# Patient Record
Sex: Female | Born: 1980 | Race: White | Hispanic: No | Marital: Married | Smoking: Never smoker
Health system: Southern US, Community
[De-identification: ages and names within clinical notes are randomized; demographics above are authoritative.]

## PROBLEM LIST (undated history)

## (undated) DIAGNOSIS — Z808 Family history of malignant neoplasm of other organs or systems: Secondary | ICD-10-CM

## (undated) DIAGNOSIS — Z803 Family history of malignant neoplasm of breast: Secondary | ICD-10-CM

## (undated) DIAGNOSIS — R011 Cardiac murmur, unspecified: Secondary | ICD-10-CM

## (undated) DIAGNOSIS — Z8051 Family history of malignant neoplasm of kidney: Secondary | ICD-10-CM

## (undated) DIAGNOSIS — Z8049 Family history of malignant neoplasm of other genital organs: Secondary | ICD-10-CM

## (undated) DIAGNOSIS — Z8041 Family history of malignant neoplasm of ovary: Secondary | ICD-10-CM

## (undated) DIAGNOSIS — R61 Generalized hyperhidrosis: Secondary | ICD-10-CM

## (undated) DIAGNOSIS — F419 Anxiety disorder, unspecified: Secondary | ICD-10-CM

## (undated) DIAGNOSIS — E349 Endocrine disorder, unspecified: Secondary | ICD-10-CM

## (undated) DIAGNOSIS — I10 Essential (primary) hypertension: Secondary | ICD-10-CM

## (undated) HISTORY — DX: Anxiety disorder, unspecified: F41.9

## (undated) HISTORY — DX: Family history of malignant neoplasm of breast: Z80.3

## (undated) HISTORY — DX: Family history of malignant neoplasm of ovary: Z80.41

## (undated) HISTORY — DX: Family history of malignant neoplasm of other genital organs: Z80.49

## (undated) HISTORY — DX: Essential (primary) hypertension: I10

## (undated) HISTORY — DX: Endocrine disorder, unspecified: E34.9

## (undated) HISTORY — DX: Family history of malignant neoplasm of kidney: Z80.51

## (undated) HISTORY — DX: Family history of malignant neoplasm of other organs or systems: Z80.8

---
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
## 1998-06-26 ENCOUNTER — Ambulatory Visit (HOSPITAL_COMMUNITY): Admission: RE | Admit: 1998-06-26 | Discharge: 1998-06-26 | Payer: Self-pay | Admitting: Family Medicine

## 1999-07-15 ENCOUNTER — Other Ambulatory Visit: Admission: RE | Admit: 1999-07-15 | Discharge: 1999-07-15 | Payer: Self-pay | Admitting: *Deleted

## 2002-02-24 ENCOUNTER — Other Ambulatory Visit: Admission: RE | Admit: 2002-02-24 | Discharge: 2002-02-24 | Payer: Self-pay | Admitting: *Deleted

## 2003-02-27 ENCOUNTER — Other Ambulatory Visit: Admission: RE | Admit: 2003-02-27 | Discharge: 2003-02-27 | Payer: Self-pay | Admitting: Gynecology

## 2004-01-14 HISTORY — PX: TONSILLECTOMY: SHX5217

## 2004-03-06 ENCOUNTER — Other Ambulatory Visit: Admission: RE | Admit: 2004-03-06 | Discharge: 2004-03-06 | Payer: Self-pay | Admitting: Gynecology

## 2004-11-01 ENCOUNTER — Emergency Department (HOSPITAL_COMMUNITY): Admission: EM | Admit: 2004-11-01 | Discharge: 2004-11-01 | Payer: Self-pay | Admitting: Emergency Medicine

## 2004-11-01 ENCOUNTER — Ambulatory Visit (HOSPITAL_BASED_OUTPATIENT_CLINIC_OR_DEPARTMENT_OTHER): Admission: RE | Admit: 2004-11-01 | Discharge: 2004-11-02 | Payer: Self-pay | Admitting: Otolaryngology

## 2004-11-01 ENCOUNTER — Encounter (INDEPENDENT_AMBULATORY_CARE_PROVIDER_SITE_OTHER): Payer: Self-pay | Admitting: Specialist

## 2004-11-01 ENCOUNTER — Ambulatory Visit (HOSPITAL_COMMUNITY): Admission: RE | Admit: 2004-11-01 | Discharge: 2004-11-01 | Payer: Self-pay | Admitting: Otolaryngology

## 2005-04-15 ENCOUNTER — Other Ambulatory Visit: Admission: RE | Admit: 2005-04-15 | Discharge: 2005-04-15 | Payer: Self-pay | Admitting: Gynecology

## 2006-04-20 ENCOUNTER — Other Ambulatory Visit: Admission: RE | Admit: 2006-04-20 | Discharge: 2006-04-20 | Payer: Self-pay | Admitting: Gynecology

## 2007-04-21 ENCOUNTER — Other Ambulatory Visit: Admission: RE | Admit: 2007-04-21 | Discharge: 2007-04-21 | Payer: Self-pay | Admitting: Gynecology

## 2008-04-24 ENCOUNTER — Encounter: Payer: Self-pay | Admitting: Women's Health

## 2008-04-24 ENCOUNTER — Other Ambulatory Visit: Admission: RE | Admit: 2008-04-24 | Discharge: 2008-04-24 | Payer: Self-pay | Admitting: Obstetrics and Gynecology

## 2008-04-24 ENCOUNTER — Ambulatory Visit: Payer: Self-pay | Admitting: Women's Health

## 2009-01-13 HISTORY — PX: WISDOM TOOTH EXTRACTION: SHX21

## 2009-04-25 ENCOUNTER — Ambulatory Visit: Payer: Self-pay | Admitting: Women's Health

## 2009-04-25 ENCOUNTER — Other Ambulatory Visit: Admission: RE | Admit: 2009-04-25 | Discharge: 2009-04-25 | Payer: Self-pay | Admitting: Gynecology

## 2010-05-01 ENCOUNTER — Encounter: Payer: Self-pay | Admitting: Women's Health

## 2010-05-08 ENCOUNTER — Encounter: Payer: Self-pay | Admitting: Women's Health

## 2010-05-17 ENCOUNTER — Other Ambulatory Visit: Payer: Self-pay | Admitting: Women's Health

## 2010-05-17 ENCOUNTER — Other Ambulatory Visit (HOSPITAL_COMMUNITY)
Admission: RE | Admit: 2010-05-17 | Discharge: 2010-05-17 | Disposition: A | Discharge status: Home / Self Care | Payer: BC Managed Care – PPO | Source: Ambulatory Visit | Attending: Gynecology | Admitting: Gynecology

## 2010-05-17 ENCOUNTER — Encounter (INDEPENDENT_AMBULATORY_CARE_PROVIDER_SITE_OTHER): Payer: BC Managed Care – PPO | Admitting: Women's Health

## 2010-05-17 DIAGNOSIS — E039 Hypothyroidism, unspecified: Secondary | ICD-10-CM

## 2010-05-17 DIAGNOSIS — Z01419 Encounter for gynecological examination (general) (routine) without abnormal findings: Secondary | ICD-10-CM

## 2010-05-17 DIAGNOSIS — Z124 Encounter for screening for malignant neoplasm of cervix: Secondary | ICD-10-CM | POA: Insufficient documentation

## 2010-05-31 NOTE — Op Note (Signed)
NAMEMORAYMA, GODOWN NO.:  [id number]   MEDICAL RECORD NO.:  [id number]          PATIENT TYPE:  OUT   LOCATION:  DFTL                         FACILITY:  [facility]   PHYSICIAN:  Lucky Cowboy, MD         DATE OF BIRTH:  Oct 09, 1980   DATE OF PROCEDURE:  11/01/2004  DATE OF DISCHARGE:  11/01/2004                                 OPERATIVE REPORT   PREOPERATIVE DIAGNOSES:  1.  Chronic tonsillitis.  2.  Bilateral inferior turbinate hypertrophy.   POSTOPERATIVE DIAGNOSES:  1.  Chronic tonsillitis  2.  Bilateral inferior turbinate hypertrophy.  3.  Adenoid hypertrophy.   PROCEDURE:  1.  Adenotonsillectomy.  2.  Bilateral inferior turbinate reductions.   SURGEON:  Lucky Cowboy, MD.   ANESTHESIA:  General endotracheal anesthesia.   ESTIMATED BLOOD LOSS:  30 cc.   SPECIMENS:  Tonsils and adenoids.   COMPLICATIONS:  None.   INDICATIONS:  Patient is a 30 year old female with frequent sore throats for  as long as she can remember. She experiences sore throats every 2 months.  She does have a history of strep throat. There is also nasal congestion.  This has not responded well to steroid nasal spray in the past. She was  noted to have 3+ bilateral cryptic tonsils. There is a 1.5 cm left zone II  lymph node. Further, there is moderate inferior turbinate hypertrophy. For  these reasons, the above procedures are performed.   FINDINGS:  The patient was noted to have 3+ bilateral cryptic palatine  tonsils. Further, on inspection of the nasopharynx, there is an obstructing  amount of adenoid hypertrophy. This was noted at the time of doing the  inferior turbinate reductions. There was both bony and mucosal inferior  turbinate enlargement.   PROCEDURE:  The patient was taken to the operating room and placed on the  table in the supine position. She was then placed under general endotracheal  anesthesia and the table rotated counterclockwise 90 degrees. Initially, the  inferior turbinate reduction was performed. Both of the inferior turbinates  were decongested with Afrin on cottonoid pledgets.  Lidocaine 1% with  1:100,000 epinephrine was then used to inject both of the inferior  turbinates. The microdebrider was used to debride the inferior one-half of  both the inferior turbinates. Through-cut forceps were then used to remove  the inferior  one-half of both the inferior turbinates. Suction cautery was  used for hemostasis.   At this point, attention was turned to the tonsillectomy portion of the  procedure. The head and body were draped in the usual fashion. This also had  been performed for the inferior turbinate reduction. A Crowe-Davis mouth gag  with a #3 tongue blade was then placed intraorally, opened and suspended on  the [facility] stand. The right palatine tonsil was grasped with Allis clamps and  directed inferomedially. The Harmonic scalpel was then used to excise the  tonsil, staying within the peritonsillar space adjacent to the tonsillar  capsule. The left palatine tonsil was removed in an identical fashion. After  seeing that there was a profuse amount of adenoid  hypertrophy at the  turbinate portion of the procedure, the palate was then elevated after  palpating the palate to ensure no evidence of a submucosal cleft. A red  rubber catheter was placed on the left nostril, brought out through the oral  cavity and sutured in place with a hemostat. A large adenoid curet was  placed against the bone and directed inferiorly, severing the majority the  adenoid pad. The remainder was removed using subsequent passes. Two sterile  gauze packs were placed in the nasopharynx and time allowed for hemostasis.  Packs were removed and suction cautery performed. The nasopharynx was  copiously irrigated with normal saline, which was suctioned out through the  oral cavity. An NG tube was placed on the esophagus for suctioning of the  gastric contents. The  mouth gag was removed, noting no damage to the teeth  or soft tissues. The mouth gag was removed and that the table was rotated  clockwise 90 degrees to its original position. The patient was awakened from  anesthesia and taken to the Post Anesthesia Care Unit in stable condition.  There were no complications.      Lucky Cowboy, MD  Electronically Signed     SJ/MEDQ  D:  12/22/2004  T:  12/23/2004  Job:  [id number]

## 2010-05-31 NOTE — Op Note (Signed)
NAMEMarland Kitchen  WONDER, DONAWAY NO.:  [id number]   MEDICAL RECORD NO.:  [id number]          PATIENT TYPE:  EMS   LOCATION:  MAJO                         FACILITY:  [facility]   PHYSICIAN:  Suzanna Obey, M.D.       DATE OF BIRTH:  October 08, 1980   DATE OF PROCEDURE:  11/01/2004  DATE OF DISCHARGE:  11/01/2004                                 OPERATIVE REPORT   PREOPERATIVE DIAGNOSIS:  Tonsillar hemorrhage.   POSTOPERATIVE DIAGNOSIS:  Tonsillar hemorrhage.   PROCEDURE:  Cauterization of left tonsil hemorrhage.   ANESTHESIA:  General endotracheal tube anesthesia.   ESTIMATED BLOOD LOSS:  Approximately 300 mL from previous bleeding.   INDICATIONS FOR PROCEDURE:  This is a 30 year old who has had a  tonsillectomy today and a turbinate reduction.  She had some bleeding  earlier in the day and it was stopped with ice water gargling.  She now has  had bleeding that has persisted for about an hour and it is causing clots in  her throat and mouth.  She was informed of the risks and benefits including  bleeding, infection, scarring, persistent bleeding, and risk of the  anesthetic.  All questions were answered and consent was obtained.   DESCRIPTION OF PROCEDURE:  Patient taken to the operating room and placed in  the supine position.  After adequate general endotracheal tube anesthesia,  was placed in the rose position, draped in the usual sterile manner.  CroweEarlene Plater mouth gag was inserted, retracted and suspected from the [facility] stand.  There was a large amount of clot and blood in the oral cavity, oropharynx.  It was suctioned out.  There was a clot in the left tonsil that was removed  and once removed, there was an obvious pumping artery that was cauterized  with the suction cautery.  There was several other oozing spots in the  inferior and lateral aspects of the tonsillar fossa.  There was one spot in  the right tonsillar fossa that was just oozing.  This seemed to correct all  the  bleeding.  It was irrigated with saline.  There was no further sights  that could be identified.  The Crowe-Davis was released and there was still  good hemostasis.  The hypopharynx, esophagus and stomach were suctioned with  the NG tube where about 250 to 300 mL of bleed was suctioned from the  stomach.  The Crowe-Davis was removed.  The patient was awakened and brought  to the recovery room in stable condition.  Counts correct.           ______________________________  Suzanna Obey, M.D.     JB/MEDQ  D:  11/01/2004  T:  11/03/2004  Job:  [id number]

## 2011-01-14 NOTE — L&D Delivery Note (Signed)
Operative Delivery Note At 8:23 AM a viable female was delivered via Vaginal, Vacuum Investment banker, operational).  Presentation: vertex; Position: Right,, Occiput,, Transverse; Station: +2. Given recurrent decels to 80-90's over 20 min, decision was made to expedite delivery.   Verbal consent: obtained from patient.  Risks and benefits discussed in detail.  Risks include, but are not limited to the risks of anesthesia, bleeding, infection, damage to maternal tissues, fetal cephalhematoma and intracranial hemorrhage. Need for good maternal effort. There is also the risk of inability to effect vaginal delivery of the head, or shoulder dystocia that cannot be resolved by established maneuvers, leading to the need for emergency cesarean section.  APGAR: 7, 9; weight .  pending Placenta status: Intact, Spontaneous.   Cord: 3 vessels with the following complications: None.  Cord pH: pending  Anesthesia: Epidural  Instruments: Kiwi vacuum. Vacuum placed, single pull with good maternal effort led to delivery of head to perineum, past pubic symphysis. Vacuum popped off, remainder of head delivered w/ maternal expulsive efforts. Tight nuchal x 1 clamped and cut, easy delivery of shoulders. Episiotomy: none Lacerations:b/l upper labial, small mucosal sulcal Suture Repair: 3.0 vicryl to sulcal, 4-0 vicryl in a single figure of 8 to each side to labia minora Est. Blood Loss (mL): 300  Mom to postpartum.  Baby to nursery-stable.  FOGLEMAN,KELLY A. 01/02/2012, 8:52 AM

## 2011-05-16 ENCOUNTER — Encounter: Payer: Self-pay | Admitting: Women's Health

## 2011-05-16 DIAGNOSIS — E349 Endocrine disorder, unspecified: Secondary | ICD-10-CM | POA: Insufficient documentation

## 2011-05-16 DIAGNOSIS — F419 Anxiety disorder, unspecified: Secondary | ICD-10-CM | POA: Insufficient documentation

## 2011-05-19 ENCOUNTER — Encounter: Payer: BC Managed Care – PPO | Admitting: Women's Health

## 2011-05-23 ENCOUNTER — Encounter: Payer: BC Managed Care – PPO | Admitting: Women's Health

## 2011-05-30 ENCOUNTER — Encounter: Payer: BC Managed Care – PPO | Admitting: Women's Health

## 2011-06-06 ENCOUNTER — Telehealth: Payer: Self-pay | Admitting: *Deleted

## 2011-06-06 NOTE — Telephone Encounter (Signed)
Pt is calling requesting something to take to clam her nerves, she is currently about [redacted] weeks pregnant and it was unexpected per pt. She is having a hard time dealing with this, she is on her way to [place] now, pt said she has taking lexapro in past. Call back # if needed [phone]

## 2011-06-06 NOTE — Telephone Encounter (Signed)
Telephone call, in [place] for a family wedding weekend. Tearful on the phone, states is not sure that she wants to continue pregnancy, was seen at clinic for termination but was not able to go through with it at that time. Had been on Lexapro in the past for anxiety, has been off of it for years. Reviewed not a good time to restart at 9 weeks but to start  Later in pregnancy, would take several weeks to get into her system. Reviewed if she terminates at start back on it,  reviewed importance of no alcohol. Encouraged to talk to a counselor to help with decision.

## 2011-06-26 LAB — OB RESULTS CONSOLE GC/CHLAMYDIA
Chlamydia: NEGATIVE
Gonorrhea: NEGATIVE

## 2011-07-02 LAB — OB RESULTS CONSOLE ABO/RH: RH Type: POSITIVE

## 2011-07-02 LAB — OB RESULTS CONSOLE HEPATITIS B SURFACE ANTIGEN: Hepatitis B Surface Ag: NEGATIVE

## 2011-07-02 LAB — OB RESULTS CONSOLE HIV ANTIBODY (ROUTINE TESTING): HIV: NONREACTIVE

## 2011-07-02 LAB — OB RESULTS CONSOLE ANTIBODY SCREEN: Antibody Screen: NEGATIVE

## 2011-07-02 LAB — OB RESULTS CONSOLE RUBELLA ANTIBODY, IGM: Rubella: IMMUNE

## 2011-10-08 LAB — OB RESULTS CONSOLE RPR: RPR: NONREACTIVE

## 2011-12-04 LAB — OB RESULTS CONSOLE GBS: GBS: NEGATIVE

## 2012-01-01 ENCOUNTER — Other Ambulatory Visit: Payer: Self-pay | Admitting: Obstetrics & Gynecology

## 2012-01-01 ENCOUNTER — Encounter (HOSPITAL_COMMUNITY): Payer: Self-pay | Admitting: *Deleted

## 2012-01-01 ENCOUNTER — Encounter (HOSPITAL_COMMUNITY): Payer: Self-pay | Admitting: Anesthesiology

## 2012-01-01 ENCOUNTER — Inpatient Hospital Stay (HOSPITAL_COMMUNITY)
Admission: AD | Admit: 2012-01-01 | Discharge: 2012-01-04 | DRG: 775 | Disposition: A | Discharge status: Home / Self Care | Payer: MEDICAID | Source: Ambulatory Visit | Attending: Obstetrics | Admitting: Obstetrics

## 2012-01-01 ENCOUNTER — Inpatient Hospital Stay (HOSPITAL_COMMUNITY): Payer: Self-pay | Admitting: Anesthesiology

## 2012-01-01 DIAGNOSIS — D649 Anemia, unspecified: Secondary | ICD-10-CM | POA: Diagnosis not present

## 2012-01-01 DIAGNOSIS — O9903 Anemia complicating the puerperium: Secondary | ICD-10-CM | POA: Diagnosis not present

## 2012-01-01 HISTORY — DX: Cardiac murmur, unspecified: R01.1

## 2012-01-01 LAB — CBC
HCT: 32.4 % — ABNORMAL LOW (ref 36.0–46.0)
Hemoglobin: 11.8 g/dL — ABNORMAL LOW (ref 12.0–15.0)
MCH: 32.2 pg (ref 26.0–34.0)
MCHC: 36.4 g/dL — ABNORMAL HIGH (ref 30.0–36.0)
MCV: 88.3 fL (ref 78.0–100.0)
Platelets: 162 10*3/uL (ref 150–400)
RBC: 3.67 MIL/uL — ABNORMAL LOW (ref 3.87–5.11)
RDW: 12.6 % (ref 11.5–15.5)
WBC: 11.6 10*3/uL — ABNORMAL HIGH (ref 4.0–10.5)

## 2012-01-01 MED ORDER — IBUPROFEN 600 MG PO TABS: 600.0000 mg | ORAL_TABLET | Freq: Four times a day (QID) | ORAL | Status: DC | PRN

## 2012-01-01 MED ORDER — EPHEDRINE 5 MG/ML INJ
10.0000 mg | INTRAVENOUS | Status: DC | PRN
  Filled 2012-01-01 (×2): qty 4

## 2012-01-01 MED ORDER — OXYCODONE-ACETAMINOPHEN 5-325 MG PO TABS: 1.0000 | ORAL_TABLET | ORAL | Status: DC | PRN

## 2012-01-01 MED ORDER — PHENYLEPHRINE 40 MCG/ML (10ML) SYRINGE FOR IV PUSH (FOR BLOOD PRESSURE SUPPORT)
80.0000 ug | PREFILLED_SYRINGE | INTRAVENOUS | Status: DC | PRN
  Filled 2012-01-01: qty 5

## 2012-01-01 MED ORDER — ONDANSETRON HCL 4 MG/2ML IJ SOLN: 4.0000 mg | Freq: Four times a day (QID) | INTRAMUSCULAR | Status: DC | PRN

## 2012-01-01 MED ORDER — LACTATED RINGERS IV SOLN
500.0000 mL | Freq: Once | INTRAVENOUS | Status: AC
  Administered 2012-01-01: 500 mL via INTRAVENOUS

## 2012-01-01 MED ORDER — LIDOCAINE HCL (PF) 1 % IJ SOLN
30.0000 mL | INTRAMUSCULAR | Status: DC | PRN
  Filled 2012-01-01: qty 30

## 2012-01-01 MED ORDER — DIPHENHYDRAMINE HCL 50 MG/ML IJ SOLN
12.5000 mg | INTRAMUSCULAR | Status: DC | PRN
  Administered 2012-01-02: 12.5 mg via INTRAVENOUS
  Filled 2012-01-01: qty 1

## 2012-01-01 MED ORDER — PHENYLEPHRINE 40 MCG/ML (10ML) SYRINGE FOR IV PUSH (FOR BLOOD PRESSURE SUPPORT)
80.0000 ug | PREFILLED_SYRINGE | INTRAVENOUS | Status: DC | PRN
Start: 2012-01-01 — End: 2012-01-02

## 2012-01-01 MED ORDER — LACTATED RINGERS IV SOLN: INTRAVENOUS | Status: DC

## 2012-01-01 MED ORDER — OXYTOCIN 40 UNITS IN LACTATED RINGERS INFUSION - SIMPLE MED
62.5000 mL/h | INTRAVENOUS | Status: DC
  Filled 2012-01-01: qty 1000

## 2012-01-01 MED ORDER — ACETAMINOPHEN 325 MG PO TABS: 650.0000 mg | ORAL_TABLET | ORAL | Status: DC | PRN

## 2012-01-01 MED ORDER — FENTANYL 2.5 MCG/ML BUPIVACAINE 1/10 % EPIDURAL INFUSION (WH - ANES)
14.0000 mL/h | INTRAMUSCULAR | Status: DC
  Administered 2012-01-02: 14 mL/h via EPIDURAL
  Filled 2012-01-01: qty 125

## 2012-01-01 MED ORDER — CITRIC ACID-SODIUM CITRATE 334-500 MG/5ML PO SOLN: 30.0000 mL | ORAL | Status: DC | PRN

## 2012-01-01 MED ORDER — EPHEDRINE 5 MG/ML INJ: 10.0000 mg | INTRAVENOUS | Status: DC | PRN

## 2012-01-01 MED ORDER — OXYTOCIN BOLUS FROM INFUSION: 500.0000 mL | INTRAVENOUS | Status: DC

## 2012-01-01 MED ORDER — LACTATED RINGERS IV SOLN
500.0000 mL | INTRAVENOUS | Status: DC | PRN
  Administered 2012-01-02 (×2): 500 mL via INTRAVENOUS

## 2012-01-01 NOTE — Progress Notes (Signed)
Dr Ernestina Penna updated on pt triage, SVE, membrane status, FHR, and UC pattern.  Orders given to put in standard labor admit orders.  Dr Ernestina Penna to report to department soon.

## 2012-01-01 NOTE — H&P (Signed)
Mikayla Schroeder is a 31 y.o. G1P0 at 39'4 presenting for SROM and active labor. Pt notes onset contractions about 15 min after SROM. Ctx freq and pain consistently increasing. Pt now asking for epidural on arrival to L&D . Good fetal movement, No vaginal bleeding, started leaking fluid about 8p.  [facility] at [facility] since 12 wks - unplanned preg, early ambivalence about TOP vs continuing preg (until 16 wks), notes in PN record about plans for paternity testing - depression, anxiety, on Zoloft - fetal growth. 12/6. 6'14, 57%, nl AFI   Prenatal Transfer Tool  Maternal Diabetes: No Genetic Screening: Normal Maternal Ultrasounds/Referrals: Normal Fetal Ultrasounds or other Referrals:  None Maternal Substance Abuse:  No Significant Maternal Medications:  None Significant Maternal Lab Results: None     OB History    Grav Para Term Preterm Abortions TAB SAB Ect Mult Living   1              Past Medical History  Diagnosis Date  . Anxiety   . Hormone disorder     Hypothyroid  . Heart murmur    Past Surgical History  Procedure Date  . Tonsillectomy 2006    TURBINATE REDUCTION  . Removal of hemangioma  1981  . Wisdom tooth extraction 2011   Family History: family history includes Breast cancer (age of onset:51) in her mother; Cancer in her father; and Diabetes in her mother. Social History:  reports that she has never smoked. She does not have any smokeless tobacco history on file. She reports that she drinks alcohol. She reports that she does not use illicit drugs.  Review of Systems - Negative except ROM, contraction pain   Dilation: 2 Effacement (%): 80 Station: -1 Exam by:: Fogleman Blood pressure 133/85, pulse 56, temperature 98.1 F (36.7 C), temperature source Oral, resp. rate 18, height 5\' 2"  (1.575 m), weight 67.132 kg (148 lb).  Physical Exam:  Gen: well appearing, no distress, uncomfortable with contractions CV: RRR Pulm: CTAB Back: no CVAT Abd: gravid,  NT, no RUQ pain LE: no edema, equal bilaterally, non-tender Toco: q 2 min FH: baseline 120's, accelerations present, no deceleratons, 10 beat variability  Prenatal labs: ABO, Rh:  O+ Antibody:  neg Rubella:  immune RPR:   NR HBsAg:   neg HIV:   neg GBS:   neg 1 hr Glucola 101  Genetic screening nl Anatomy US nl   Assessment/Plan: 31 y.o. G1P0 at 39'4 by LMP and report of early u/s - SROM, active labor, small change over past 2 hrs. Expectant management at this time - Reactive fetal testing - OK for epidural, labs pending.  FOGLEMAN,KELLY A. 01/01/2012 11:31 PM     FOGLEMAN,KELLY A. 01/01/2012, 11:26 PM

## 2012-01-01 NOTE — MAU Note (Signed)
Pt rpeorts water broke at 1930. Cleaar fluid. Ctx are coming stronger and more frequent

## 2012-01-02 ENCOUNTER — Encounter (HOSPITAL_COMMUNITY): Payer: Self-pay

## 2012-01-02 LAB — ABO/RH: ABO/RH(D): O POS

## 2012-01-02 LAB — RPR: RPR Ser Ql: NONREACTIVE

## 2012-01-02 MED ORDER — ZOLPIDEM TARTRATE 5 MG PO TABS: 5.0000 mg | ORAL_TABLET | Freq: Every evening | ORAL | Status: DC | PRN

## 2012-01-02 MED ORDER — OXYCODONE-ACETAMINOPHEN 5-325 MG PO TABS: 1.0000 | ORAL_TABLET | ORAL | Status: DC | PRN

## 2012-01-02 MED ORDER — BENZOCAINE-MENTHOL 20-0.5 % EX AERO
1.0000 "application " | INHALATION_SPRAY | CUTANEOUS | Status: DC | PRN
  Filled 2012-01-02: qty 56

## 2012-01-02 MED ORDER — IBUPROFEN 600 MG PO TABS
600.0000 mg | ORAL_TABLET | Freq: Four times a day (QID) | ORAL | Status: DC
  Administered 2012-01-02 – 2012-01-04 (×7): 600 mg via ORAL
  Filled 2012-01-02 (×7): qty 1

## 2012-01-02 MED ORDER — BISACODYL 10 MG RE SUPP: 10.0000 mg | Freq: Every day | RECTAL | Status: DC | PRN

## 2012-01-02 MED ORDER — LANOLIN HYDROUS EX OINT: TOPICAL_OINTMENT | CUTANEOUS | Status: DC | PRN

## 2012-01-02 MED ORDER — SERTRALINE HCL 50 MG PO TABS
50.0000 mg | ORAL_TABLET | Freq: Every day | ORAL | Status: DC
  Administered 2012-01-02 – 2012-01-04 (×3): 50 mg via ORAL
  Filled 2012-01-02 (×3): qty 1

## 2012-01-02 MED ORDER — PRENATAL MULTIVITAMIN CH
1.0000 | ORAL_TABLET | Freq: Every day | ORAL | Status: DC
  Administered 2012-01-02 – 2012-01-04 (×3): 1 via ORAL
  Filled 2012-01-02 (×3): qty 1

## 2012-01-02 MED ORDER — DIPHENHYDRAMINE HCL 25 MG PO CAPS: 25.0000 mg | ORAL_CAPSULE | Freq: Four times a day (QID) | ORAL | Status: DC | PRN

## 2012-01-02 MED ORDER — DIBUCAINE 1 % RE OINT
1.0000 "application " | TOPICAL_OINTMENT | RECTAL | Status: DC | PRN
  Administered 2012-01-02: 1 via RECTAL
  Filled 2012-01-02: qty 28

## 2012-01-02 MED ORDER — ONDANSETRON HCL 4 MG/2ML IJ SOLN: 4.0000 mg | INTRAMUSCULAR | Status: DC | PRN

## 2012-01-02 MED ORDER — TETANUS-DIPHTH-ACELL PERTUSSIS 5-2.5-18.5 LF-MCG/0.5 IM SUSP: 0.5000 mL | Freq: Once | INTRAMUSCULAR | Status: DC

## 2012-01-02 MED ORDER — OXYTOCIN 40 UNITS IN LACTATED RINGERS INFUSION - SIMPLE MED: 1.0000 m[IU]/min | INTRAVENOUS | Status: DC

## 2012-01-02 MED ORDER — SODIUM BICARBONATE 8.4 % IV SOLN
INTRAVENOUS | Status: DC | PRN
  Administered 2012-01-02: 5 mL via EPIDURAL

## 2012-01-02 MED ORDER — FLEET ENEMA 7-19 GM/118ML RE ENEM: 1.0000 | ENEMA | Freq: Every day | RECTAL | Status: DC | PRN

## 2012-01-02 MED ORDER — TERBUTALINE SULFATE 1 MG/ML IJ SOLN: 0.2500 mg | Freq: Once | INTRAMUSCULAR | Status: DC | PRN

## 2012-01-02 MED ORDER — WITCH HAZEL-GLYCERIN EX PADS
1.0000 "application " | MEDICATED_PAD | CUTANEOUS | Status: DC | PRN
  Administered 2012-01-02: 1 via TOPICAL

## 2012-01-02 MED ORDER — SIMETHICONE 80 MG PO CHEW: 80.0000 mg | CHEWABLE_TABLET | ORAL | Status: DC | PRN

## 2012-01-02 MED ORDER — SENNOSIDES-DOCUSATE SODIUM 8.6-50 MG PO TABS
2.0000 | ORAL_TABLET | Freq: Every day | ORAL | Status: DC
  Administered 2012-01-02 – 2012-01-03 (×2): 2 via ORAL

## 2012-01-02 MED ORDER — ONDANSETRON HCL 4 MG PO TABS: 4.0000 mg | ORAL_TABLET | ORAL | Status: DC | PRN

## 2012-01-02 NOTE — Anesthesia Preprocedure Evaluation (Signed)

## 2012-01-02 NOTE — Anesthesia Postprocedure Evaluation (Signed)
  Anesthesia Post-op Note  Patient: Mikayla Schroeder  Procedure(s) Performed: * No procedures listed *  Patient Location: Mother/Baby  Anesthesia Type:Epidural  Level of Consciousness: awake  Airway and Oxygen Therapy: Patient Spontanous Breathing  Post-op Pain: none  Post-op Assessment: Patient's Cardiovascular Status Stable, Respiratory Function Stable, Patent Airway, No signs of Nausea or vomiting, Adequate PO intake, Pain level controlled, No headache, No backache, No residual numbness and No residual motor weakness  Post-op Vital Signs: Reviewed and stable  Complications: No apparent anesthesia complications

## 2012-01-02 NOTE — Progress Notes (Signed)
Dr Ernestina Penna updated on SVE, UC pattern, FHR.  Dr will pull up strip to review remotely and will come in soon to evaluate in person.  No new orders given.

## 2012-01-02 NOTE — Progress Notes (Signed)
D/w RN pt status  Pt comfortable with epidural  Filed Vitals:   01/02/12 0447 01/02/12 0448 01/02/12 0502 01/02/12 0532  BP:   125/68 117/59  Pulse: 51 51 46 42  Temp:      TempSrc:      Resp:   18 18  Height:      Weight:      SpO2: 100% 100%     Cvx: 5/+1 per RN Strip reveiwed: toco: q 3 min FH: 120's, + accels, 10 beat var. Early decels w/ each ctx w/ good recovery. More prollonged about 1 hr ago to 90's for 1 min  CBC    Component Value Date/Time   WBC 11.6* 01/01/2012 2225   RBC 3.67* 01/01/2012 2225   HGB 11.8* 01/01/2012 2225   HCT 32.4* 01/01/2012 2225   PLT 162 01/01/2012 2225   MCV 88.3 01/01/2012 2225   MCH 32.2 01/01/2012 2225   MCHC 36.4* 01/01/2012 2225   RDW 12.6 01/01/2012 2225    A/P: SROM w/ active labor. COnt expectant management Epidural Overall reactive fetal testing. If variables get deeper/ longer will plan IUPC and amnioinfusion. LIkey cord compression. Cont with position changes.   FOGLEMAN,KELLY A. 01/02/2012 6:00 AM

## 2012-01-02 NOTE — Anesthesia Procedure Notes (Signed)

## 2012-01-03 LAB — CBC
HCT: 30.4 % — ABNORMAL LOW (ref 36.0–46.0)
Hemoglobin: 10.5 g/dL — ABNORMAL LOW (ref 12.0–15.0)
MCH: 31.4 pg (ref 26.0–34.0)
MCHC: 34.5 g/dL (ref 30.0–36.0)
MCV: 91 fL (ref 78.0–100.0)
Platelets: 163 10*3/uL (ref 150–400)
RBC: 3.34 MIL/uL — ABNORMAL LOW (ref 3.87–5.11)
RDW: 12.9 % (ref 11.5–15.5)
WBC: 20.6 10*3/uL — ABNORMAL HIGH (ref 4.0–10.5)

## 2012-01-03 NOTE — Progress Notes (Signed)
PPD 1 VAVD  S:  Reports feeling well             Tolerating po/ No nausea or vomiting             Bleeding is light             Pain controlled with motrin only             Up ad lib / ambulatory  Newborn breast feeding     O:               VS: BP 136/77  Pulse 89  Temp 97.9 F (36.6 C) (Oral)  Resp 20  Ht 5\' 2"  (1.575 m)  Wt 67.132 kg (148 lb)  BMI 27.07 kg/m2  SpO2 97%  Breastfeeding? Unknown   LABS:  Basename 01/03/12 0505 01/01/12 2225  WBC 20.6* 11.6*  HGB 10.5* 11.8*  PLT 163 162                                          I&O:                         I/O last 3 completed shifts: In: -  Out: 750 [Urine:500; Blood:250]               Physical Exam:             Alert and oriented X3  Lungs: Clear and unlabored  Heart: regular rate and rhythm / no mumurs  Abdomen: soft, non-tender, non-distended              Fundus: firm, non-tender, U-1  Perineum: moderate edema  Lochia: light  Extremities: no edema, no calf pain or tenderness    A: PPD # 1VAVD   Doing well - stable status  P:  Routine post partum orders  discharge to home tomorrow   Marlinda Mike CNM, MSN 01/03/2012, 11:10 AM

## 2012-01-04 MED ORDER — IBUPROFEN 600 MG PO TABS: 600.0000 mg | ORAL_TABLET | Freq: Four times a day (QID) | ORAL | Status: DC | PRN

## 2012-01-04 NOTE — Progress Notes (Signed)
Post Partum Day 2 VAVD without complications viable female infant. Subjective: no complaints, up ad lib without syncope, voiding, tolerating PO, + flatus, +BM  Pain well controlled with po meds, taking motrin and percocet  BF: on demand Mood stable, bonding well Contraception:    Objective: Blood pressure 118/72, pulse 45, temperature 98 F (36.7 C), temperature source Oral, resp. rate 18, height 5\' 2"  (1.575 m), weight 67.132 kg (148 lb), SpO2 97.00%, unknown if currently breastfeeding.  Physical Exam:  General: alert, cooperative and no distress Breasts: N/T Lungs: CTAB Heart: RRR Lochia: appropriate Uterine Fundus: firm. -2/u Perineum:healing well DVT Evaluation: No evidence of DVT seen on physical exam. Negative Homan's sign. No cords or calf tenderness. No significant calf/ankle edema.   Basename 01/03/12 0505 01/01/12 2225  HGB 10.5* 11.8*  HCT 30.4* 32.4*   Anemia of pregnancy - delivered  Po Iron supplement daily.  Assessment/Plan: Discharge home       LOS: 3 days   DAVIES, DENISE 01/04/2012, 12:13 PM

## 2012-01-04 NOTE — Discharge Summary (Signed)
Physician Discharge Summary  Patient ID: Mikayla Schroeder MRN: [id number] DOB/AGE: [birth year] 33 y.o.  Admit date: 01/01/2012 Discharge date: 01/04/2012  Admission Diagnoses: Admitted in active labor at [redacted]w[redacted]d   Discharge Diagnoses:  Active Problems:  Vacuum extractor delivery, delivered  Postpartum care following VAVD w/ 1st LAC (12/20)   Discharged Condition: stable  Hospital Course: VAVD viable female with 1st degree labial laceration. Uncomplicated postpartum course to date.  Consults: None  Significant Diagnostic Studies: labs: Routine prenatal labs - normal, microbiology: GBS: normal and radiology: Ultrasound: anatomy - normal  Treatments: IV hydration and analgesia: acetaminophen w/ codeine and ibuprofen.  Discharge Exam: Blood pressure 118/72, pulse 45, temperature 98 F (36.7 C), temperature source Oral, resp. rate 18, height 5\' 2"  (1.575 m), weight 67.132 kg (148 lb), SpO2 97.00%, unknown if currently breastfeeding.  Physical Examination: General appearance: alert, cooperative and no distress Affect: AAO x3 Lungs: CTAB Breasts: N/T CV: RRR Abdomen: Soft. N/T , B/S x 4, Diaphysis of Rcetus Muscles noted - patient advised. Fundus:-3/u, Firm Lochia Rubra, Min. Perineum: Healing well GI: normal GU: No problems voiding. Extremities: No edema / swelling bilaterally.    Disposition: Final discharge disposition not confirmed  Discharge Orders    Future Orders Please Complete By Expires   Diet general      Discharge instructions      Comments:   Per [facility] Booklet       Medication List     As of 01/04/2012 12:41 PM    TAKE these medications         ibuprofen 600 MG tablet   Commonly known as: ADVIL,MOTRIN   Take 1 tablet (600 mg total) by mouth every 6 (six) hours as needed for pain.      OVER THE COUNTER MEDICATION   Take 1 tablet by mouth.      prenatal multivitamin Tabs   Take 1 tablet by mouth daily.      sertraline 50 MG tablet   Commonly known as: ZOLOFT   Take 50 mg by mouth daily.           Follow-up Information    Follow up with [facility] Schedule an appointment as soon as possible for a visit in 6 weeks. (As needed)    Contact information:   [address] [place] [address] [phone]        VAVD viable female. Uncomplicated hospital course.  SignedEarl Gala, CNM. 01/04/2012, 12:41 PM

## 2012-01-08 ENCOUNTER — Encounter (HOSPITAL_COMMUNITY): Payer: Self-pay | Admitting: *Deleted

## 2012-01-12 ENCOUNTER — Inpatient Hospital Stay (HOSPITAL_COMMUNITY): Admission: RE | Admit: 2012-01-12 | Payer: BC Managed Care – PPO | Source: Ambulatory Visit

## 2012-01-13 ENCOUNTER — Inpatient Hospital Stay (HOSPITAL_COMMUNITY)
Admission: AD | Admit: 2012-01-13 | Discharge: 2012-01-13 | Disposition: A | Discharge status: Home / Self Care | Payer: BC Managed Care – PPO | Source: Ambulatory Visit | Attending: Obstetrics | Admitting: Obstetrics

## 2012-01-13 ENCOUNTER — Encounter (HOSPITAL_COMMUNITY): Payer: Self-pay | Admitting: Obstetrics and Gynecology

## 2012-01-13 DIAGNOSIS — R030 Elevated blood-pressure reading, without diagnosis of hypertension: Secondary | ICD-10-CM | POA: Insufficient documentation

## 2012-01-13 DIAGNOSIS — O99893 Other specified diseases and conditions complicating puerperium: Secondary | ICD-10-CM | POA: Insufficient documentation

## 2012-01-13 DIAGNOSIS — R51 Headache: Secondary | ICD-10-CM | POA: Insufficient documentation

## 2012-01-13 HISTORY — DX: Generalized hyperhidrosis: R61

## 2012-01-13 LAB — COMPREHENSIVE METABOLIC PANEL
ALT: 16 U/L (ref 0–35)
AST: 14 U/L (ref 0–37)
Albumin: 2.9 g/dL — ABNORMAL LOW (ref 3.5–5.2)
Alkaline Phosphatase: 89 U/L (ref 39–117)
BUN: 12 mg/dL (ref 6–23)
CO2: 27 mEq/L (ref 19–32)
Calcium: 8.8 mg/dL (ref 8.4–10.5)
Chloride: 103 mEq/L (ref 96–112)
Creatinine, Ser: 0.69 mg/dL (ref 0.50–1.10)
GFR calc Af Amer: >90 mL/min (ref >90)
GFR calc non Af Amer: >90 mL/min (ref >90)
Glucose, Bld: 101 mg/dL — ABNORMAL HIGH (ref 70–99)
Potassium: 3.4 mEq/L — ABNORMAL LOW (ref 3.5–5.1)
Sodium: 140 mEq/L (ref 135–145)
Total Bilirubin: 0.3 mg/dL (ref 0.3–1.2)
Total Protein: 6.2 g/dL (ref 6.0–8.3)

## 2012-01-13 LAB — CBC
HCT: 33.2 % — ABNORMAL LOW (ref 36.0–46.0)
Hemoglobin: 11.4 g/dL — ABNORMAL LOW (ref 12.0–15.0)
MCH: 31.2 pg (ref 26.0–34.0)
MCHC: 34.3 g/dL (ref 30.0–36.0)
MCV: 91 fL (ref 78.0–100.0)
Platelets: 210 10*3/uL (ref 150–400)
RBC: 3.65 MIL/uL — ABNORMAL LOW (ref 3.87–5.11)
RDW: 12.4 % (ref 11.5–15.5)
WBC: 13.7 10*3/uL — ABNORMAL HIGH (ref 4.0–10.5)

## 2012-01-13 LAB — URIC ACID: Uric Acid, Serum: 5 mg/dL (ref 2.4–7.0)

## 2012-01-13 MED ORDER — LABETALOL HCL 5 MG/ML IV SOLN: 10.0000 mg | Freq: Once | INTRAVENOUS | Status: DC

## 2012-01-13 MED ORDER — BUTALBITAL-APAP-CAFFEINE 50-325-40 MG PO TABS
2.0000 | ORAL_TABLET | Freq: Once | ORAL | Status: AC
  Administered 2012-01-13: 2 via ORAL
  Filled 2012-01-13: qty 2

## 2012-01-13 MED ORDER — NIFEDIPINE ER OSMOTIC RELEASE 30 MG PO TB24: 30.0000 mg | ORAL_TABLET | Freq: Every day | ORAL | Status: DC

## 2012-01-13 MED ORDER — LACTATED RINGERS IV SOLN: INTRAVENOUS | Status: DC

## 2012-01-13 MED ORDER — NIFEDIPINE 10 MG PO CAPS
20.0000 mg | ORAL_CAPSULE | Freq: Once | ORAL | Status: AC
  Administered 2012-01-13: 20 mg via ORAL
  Filled 2012-01-13: qty 2

## 2012-01-13 NOTE — H&P (Signed)
Chief complaint: Headache, hypertension  History present illness: 31 year old G1 P1 one and a half weeks status post vacuum-assisted vaginal delivery at term presents with headache and elevated blood pressures at home and in the office. Patient notes headache over the past week. Patient states she called the office after headache had persisted for one week despite rest and holding the baby in different positions. Home health visit the patient and noted a blood pressure 164/100. Patient was then sent to the office for evaluation. Blood pressures were similar in the office. Patient now presents here for evaluation.  Patient notes headache has been left frontal for the past week. Patient has denies vision change, right upper quadrant pain. Patient is eating normally. Patient notes no significant swelling. Bleeding is minimal and appropriate since vaginal delivery. Patient notes no pain.  Past medical history: Depression  Medications: Zoloft, prenatal vitamin No known drug allergies  Physical exam: Filed Vitals:   01/13/12 1604 01/13/12 1625 01/13/12 1631  BP: 160/79 154/87 161/79  Pulse: 49 49 52  Temp: 98 F (36.7 C)    TempSrc: Oral    Resp: 16    SpO2: 100%     General: Well-appearing, in no distress Cardiovascular: Bradycardic, systolic murmur present, 4/6, normal rhythm Pulmonary: Clear to auscultation bilaterally No costovertebral angle tenderness Abdomen: Nontender, nondistended, fundus firm below umbilicus GU: Deferred Lower extremity: No lower extremity edema, DTRs 1+, no clonus  Labs: Pending  Assessment and plan: 31 year old G1 P1 10 days status post spontaneous vaginal delivery who presents with headache and findings of hypertension. No exam evidence of preeclampsia. Labs are pending. This most likely represents postpartum gestational hypertension without preeclampsia. Will give Procardia now and likely DC patient home with Procardia XL and followup in the office in 2 days  for blood pressure check. Labetalol would be first line therapy patient does have bradycardia and needle blockade is not advisable at this time.  FOGLEMAN,KELLY A. 01/13/2012 4:45 PM

## 2012-01-13 NOTE — MAU Note (Signed)
"  I called last week with complaints of a headache.  They gave me some things to try.  I tried those things and nothing worked.  The [facility] RN took my BP and it was 160/100.  She called my doctor's office.  They had me come in and my BP in their office was 160/90.  After talking with Dr. Ernestina Penna, she had me come straight over here for more bloodwork."

## 2012-01-13 NOTE — MAU Note (Signed)
Patient states she had a SVD on 12-20. States she has had a headache off and on since delivery but not going away. Was seen int he office today with elevated BP and sent to MAU for evaluation. No relief with Tylenol.

## 2012-02-28 ENCOUNTER — Other Ambulatory Visit: Payer: Self-pay

## 2012-11-18 ENCOUNTER — Other Ambulatory Visit: Payer: Self-pay

## 2013-10-28 ENCOUNTER — Other Ambulatory Visit: Payer: Self-pay

## 2013-11-14 ENCOUNTER — Encounter (HOSPITAL_COMMUNITY): Payer: Self-pay | Admitting: Obstetrics and Gynecology

## 2016-05-05 ENCOUNTER — Ambulatory Visit (INDEPENDENT_AMBULATORY_CARE_PROVIDER_SITE_OTHER): Payer: BLUE CROSS/BLUE SHIELD | Admitting: Obstetrics & Gynecology

## 2016-05-05 ENCOUNTER — Encounter: Payer: Self-pay | Admitting: Obstetrics & Gynecology

## 2016-05-05 VITALS — BP 126/70 | Ht 63.0 in | Wt 139.6 lb

## 2016-05-05 DIAGNOSIS — Z1151 Encounter for screening for human papillomavirus (HPV): Secondary | ICD-10-CM

## 2016-05-05 DIAGNOSIS — Z01411 Encounter for gynecological examination (general) (routine) with abnormal findings: Secondary | ICD-10-CM | POA: Diagnosis not present

## 2016-05-05 DIAGNOSIS — M79629 Pain in unspecified upper arm: Secondary | ICD-10-CM

## 2016-05-05 DIAGNOSIS — Z30431 Encounter for routine checking of intrauterine contraceptive device: Secondary | ICD-10-CM | POA: Diagnosis not present

## 2016-05-05 DIAGNOSIS — N898 Other specified noninflammatory disorders of vagina: Secondary | ICD-10-CM | POA: Diagnosis not present

## 2016-05-05 NOTE — Addendum Note (Signed)
Addended by: Berna Spare A on: 05/05/2016 05:06 PM   Modules accepted: Orders

## 2016-05-05 NOTE — Progress Notes (Signed)
Mikayla Schroeder 1980/07/08 [id number]   History:    36 y.o.  G1P1 Daughter 4 1/2 yo, doing very well.  Stable boyfriend x 3 yrs.  Established patient presenting for annual gyn exam.  Mirena IUD x 01/2012.  No pelvic pain.  No abnormal bleeding.  Vaginal d/c, but no itching, no odor.  C/O tenderness at axillary level bilaterally.  No lump felt at breast or axillae.   Past medical history,surgical history, family history and social history were all reviewed and documented in the EPIC chart.  Gynecologic History No LMP recorded. Patient is not currently having periods (Reason: IUD). Contraception: IUD x 01/2012 Last Pap: 03/2015. Results were: normal/HPV HR neg Last mammogram: 11/2014. Results were: Benign  Obstetric History OB History  Gravida Para Term Preterm AB Living  SAB TAB Ectopic Multiple Live Births          1    # Outcome Date GA Lbr Len/2nd Weight Sex Delivery Anes PTL Lv  1 Term 01/02/12 [redacted]w[redacted]d 12:01 / 00:52 6 lb 4 oz (2.835 kg) F Vag-Vacuum EPI  LIV     Birth Comments: WNL        ROS: A ROS was performed and pertinent positives and negatives are included in the history.  GENERAL: No fevers or chills. HEENT: No change in vision, no earache, sore throat or sinus congestion. NECK: No pain or stiffness. CARDIOVASCULAR: No chest pain or pressure. No palpitations. PULMONARY: No shortness of breath, cough or wheeze. GASTROINTESTINAL: No abdominal pain, nausea, vomiting or diarrhea, melena or bright red blood per rectum. GENITOURINARY: No urinary frequency, urgency, hesitancy or dysuria. MUSCULOSKELETAL: No joint or muscle pain, no back pain, no recent trauma. DERMATOLOGIC: No rash, no itching, no lesions. ENDOCRINE: No polyuria, polydipsia, no heat or cold intolerance. No recent change in weight. HEMATOLOGICAL: No anemia or easy bruising or bleeding. NEUROLOGIC: No headache, seizures, numbness, tingling or weakness. PSYCHIATRIC: No depression, no loss of interest in  normal activity or change in sleep pattern.     Exam:   BP 126/70   Ht  (1.6 m)   Wt 139 lb 9.6 oz (63.3 kg)   Breastfeeding? No   BMI 24.73 kg/m   Body mass index is 24.73 kg/m.  General appearance : Well developed well nourished female. No acute distress HEENT: Eyes: no retinal hemorrhage or exudates,  Neck supple, trachea midline, no carotid bruits, no thyroidmegaly Lungs: Clear to auscultation, no rhonchi or wheezes, or rib retractions  Heart: Regular rate and rhythm, no murmurs or gallops Breast:Examined in sitting and supine position were symmetrical in appearance, no palpable masses or tenderness,  no skin retraction, no nipple inversion, no nipple discharge, no skin discoloration, no axillary or supraclavicular lymphadenopathy Abdomen: no palpable masses or tenderness, no rebound or guarding Extremities: no edema or skin discoloration or tenderness  Pelvic:  Bartholin, Urethra, Skene Glands: Within normal limits             Vagina: No gross lesions or discharge  Cervix: No gross lesions or discharge.  Pap/HPV HR done.  Uterus  AV, normal size, shape and consistency, non-tender and mobile  Adnexa  Without masses or tenderness  Anus and perineum  normal    Assessment/Plan:  36 y.o. female for annual exam.  1. Encounter for gynecological examination with abnormal finding Normal Annual/Gyn exam.  Pap/HPV HR pending.  2. Vaginal discharge Vaginal secretions wnl, reassured.  3. Encounter for  routine checking of intrauterine contraceptive device (IUD) IUD strings not visible.  F/U within a week for Pelvic US to locate IUD.    4. Pain, axillary, unspecified laterality Normal Breast and axillary exam.  Reassured.  Counseling >50% x 10 min on above issues.  [name], 4:19 PM 05/05/2016

## 2016-05-05 NOTE — Patient Instructions (Addendum)
Your Annual/Gyn exam was normal today, but the IUD strings were not visible.  Make sure you use condoms until we confirm good location of the IUD by Pelvic US.  I have your Medical Records from [place] and the IUD insertion date was 01/2012.  Your Pap/HPV HR were normal last yr.  A Pap/HPV HR were done today and I'll let you know the result as soon as available.   It was a pleasure to see you today!  See you soon again for the Pelvic US.

## 2016-05-06 ENCOUNTER — Other Ambulatory Visit: Payer: Self-pay | Admitting: Obstetrics & Gynecology

## 2016-05-06 DIAGNOSIS — Z30431 Encounter for routine checking of intrauterine contraceptive device: Secondary | ICD-10-CM

## 2016-05-06 DIAGNOSIS — T8332XD Displacement of intrauterine contraceptive device, subsequent encounter: Secondary | ICD-10-CM

## 2016-05-07 ENCOUNTER — Ambulatory Visit (INDEPENDENT_AMBULATORY_CARE_PROVIDER_SITE_OTHER): Payer: BLUE CROSS/BLUE SHIELD

## 2016-05-07 ENCOUNTER — Encounter: Payer: Self-pay | Admitting: Obstetrics & Gynecology

## 2016-05-07 ENCOUNTER — Other Ambulatory Visit: Payer: Self-pay

## 2016-05-07 ENCOUNTER — Ambulatory Visit: Payer: Self-pay | Admitting: Obstetrics & Gynecology

## 2016-05-07 ENCOUNTER — Ambulatory Visit (INDEPENDENT_AMBULATORY_CARE_PROVIDER_SITE_OTHER): Payer: BLUE CROSS/BLUE SHIELD | Admitting: Obstetrics & Gynecology

## 2016-05-07 ENCOUNTER — Other Ambulatory Visit: Payer: Self-pay | Admitting: Obstetrics & Gynecology

## 2016-05-07 VITALS — BP 118/72

## 2016-05-07 DIAGNOSIS — N831 Corpus luteum cyst of ovary, unspecified side: Secondary | ICD-10-CM

## 2016-05-07 DIAGNOSIS — Z30431 Encounter for routine checking of intrauterine contraceptive device: Secondary | ICD-10-CM

## 2016-05-07 DIAGNOSIS — T8332XD Displacement of intrauterine contraceptive device, subsequent encounter: Secondary | ICD-10-CM

## 2016-05-07 DIAGNOSIS — T8332XA Displacement of intrauterine contraceptive device, initial encounter: Secondary | ICD-10-CM

## 2016-05-07 NOTE — Progress Notes (Signed)
    Mikayla Schroeder Dec 12, 1980 [id number]        36 y.o.  G1P1001 presenting for Pelvic US to locate IUD.  F/U from Aex/Gyn exam 4/23rd when the IUD strings were not visible.  No pelvic pain.  No abnormal bleeding or d/c.  Past medical history,surgical history, problem list, medications, allergies, family history and social history were all reviewed and documented in the EPIC chart.  Directed ROS with pertinent positives and negatives documented in the history of present illness/assessment and plan.  Exam:  There were no vitals filed for this visit. General appearance:  Normal  Pelvic US today:  TV:  AV uterus with IUD in normal intrauterine position.  Fluid in endometrium 11 x 2 x 11 mm.  Rt Ovary normal with follicle 1.9 cm.  Lt Ovary normal.  No FF in CDS.  Assessment/Plan:  36 y.o. G1P1001  1. Intrauterine contraceptive device threads lost, initial encounter Normal IU location of IUD confirmed by [place] today.  US findings reviewed, patient reassured and information given about eventual removal of IUD when due in a situation where the strings are not visible.  Counseling >50% x 15 min on above issue.   Mikayla Del MD, 11:55 AM 05/07/2016

## 2016-05-07 NOTE — Patient Instructions (Addendum)
Your IUD strings were not visible on Annual/Gyn exam 4/23rd.  We did a Pelvic US today showing your IUD in normal intrauterine position.  A small amount of fluid was seen in the cavity which is not worrisome.  Your ovaries were normal with a small follicle on the right. I will see you next year for your Annual/Gyn exam or before if you need me.

## 2016-05-08 ENCOUNTER — Encounter: Payer: Self-pay | Admitting: Obstetrics & Gynecology

## 2016-05-08 LAB — PAP, TP IMAGING W/ HPV RNA, RFLX HPV TYPE 16,18/45: HPV mRNA, High Risk: NOT DETECTED

## 2017-06-02 ENCOUNTER — Telehealth: Payer: Self-pay | Admitting: Genetics

## 2017-06-02 ENCOUNTER — Encounter: Payer: Self-pay | Admitting: Genetics

## 2017-06-02 NOTE — Telephone Encounter (Signed)
Genetic counseling referral from Dr. Amado Nash for fhx of breast cancer. Pt has been scheduled to see Darral Dash on 7/1 at 4pm. Pt aware to arrive 30 minutes early. Letter mailed.

## 2017-07-06 ENCOUNTER — Emergency Department (INDEPENDENT_AMBULATORY_CARE_PROVIDER_SITE_OTHER)
Admission: EM | Admit: 2017-07-06 | Discharge: 2017-07-06 | Disposition: A | Discharge status: Home / Self Care | Payer: BLUE CROSS/BLUE SHIELD | Source: Home / Self Care | Attending: Family Medicine | Admitting: Family Medicine

## 2017-07-06 ENCOUNTER — Other Ambulatory Visit: Payer: Self-pay

## 2017-07-06 ENCOUNTER — Encounter: Payer: Self-pay | Admitting: Emergency Medicine

## 2017-07-06 ENCOUNTER — Telehealth: Payer: Self-pay | Admitting: Emergency Medicine

## 2017-07-06 DIAGNOSIS — J029 Acute pharyngitis, unspecified: Secondary | ICD-10-CM

## 2017-07-06 DIAGNOSIS — R52 Pain, unspecified: Secondary | ICD-10-CM

## 2017-07-06 LAB — POCT URINALYSIS DIP (MANUAL ENTRY)

## 2017-07-06 LAB — POCT RAPID STREP A (OFFICE): Rapid Strep A Screen: NEGATIVE

## 2017-07-06 NOTE — Telephone Encounter (Signed)
Keflex sent to pharmacy on file

## 2017-07-06 NOTE — ED Triage Notes (Signed)
Sore throat, fever, 102, chills, body aches x 3 days

## 2017-07-06 NOTE — ED Provider Notes (Signed)
[facility]    CSN: [id number] Arrival date & time: 07/06/17  1026     History   Chief Complaint Chief Complaint  Patient presents with  . Sore Throat    HPI Mikayla Schroeder is a 37 y.o. female.   HPI  Mikayla Schroeder is a 37 y.o. female presenting to UC with c/o 3 days of fever, Tmax 102*F, body aches, chills, and sore throat.  She has been taking Advil with moderate temporary relief.  Denies known sick contacts. The body aches are most concerning for pt.  Denies n/v/d. Denies chest pain or SOB.   Past Medical History:  Diagnosis Date  . Anxiety   . Excessive sweating   . Heart murmur   . Hormone disorder    Hypothyroid  . Hypertension     Patient Active Problem List   Diagnosis Date Noted  . Anxiety   . Hormone disorder     Past Surgical History:  Procedure Laterality Date  . REMOVAL OF HEMANGIOMA   1981  . TONSILLECTOMY  2006   TURBINATE REDUCTION  . WISDOM TOOTH EXTRACTION  2011    OB History    Gravida  1   Para  1   Term  1   Preterm      AB      Living  1     SAB      TAB      Ectopic      Multiple      Live Births  1            Home Medications    Prior to Admission medications   Medication Sig Start Date End Date Taking? Authorizing Provider  ibuprofen (ADVIL,MOTRIN) 600 MG tablet Take 1 tablet (600 mg total) by mouth every 6 (six) hours as needed for pain. 01/04/12   Earl Gala, CNM  levonorgestrel (MIRENA) 20 MCG/24HR IUD 1 each by Intrauterine route once.    [provider]  NIFEdipine (PROCARDIA-XL/ADALAT-CC/NIFEDICAL-XL) 30 MG 24 hr tablet Take 1 tablet (30 mg total) by mouth daily. 01/13/12   Noland Fordyce, MD  sertraline (ZOLOFT) 50 MG tablet Take 50 mg by mouth daily.    [provider]    Family History Family History  Problem Relation Age of Onset  . Diabetes Mother   . Breast cancer Mother 44  . Uterine cancer Mother 50  . Cancer Father        KIDNEY  . Heart failure  Maternal Grandmother   . Stroke Maternal Grandmother   . Diabetes Maternal Grandmother   . Cancer Paternal Grandmother     Social History Social History   Tobacco Use  . Smoking status: Never Smoker  . Smokeless tobacco: Never Used  Substance Use Topics  . Alcohol use: Yes    Comment: 1-2x/week  . Drug use: No     Allergies   Patient has no known allergies.   Review of Systems Review of Systems  Constitutional: Positive for chills and fever.  HENT: Positive for congestion. Negative for ear pain, sore throat, trouble swallowing and voice change.   Respiratory: Positive for cough. Negative for shortness of breath.   Cardiovascular: Negative for chest pain and palpitations.  Gastrointestinal: Negative for abdominal pain, diarrhea, nausea and vomiting.  Musculoskeletal: Positive for arthralgias, back pain, myalgias and neck pain. Negative for neck stiffness.  Skin: Negative for rash.     Physical Exam Triage Vital Signs ED Triage Vitals  Enc  Vitals Group     BP 07/06/17 1052 121/79     Pulse Rate 07/06/17 1052 (!) 44     Resp --      Temp 07/06/17 1052 98.6 F (37 C)     Temp Source 07/06/17 1052 Oral     SpO2 07/06/17 1052 98 %     Weight 07/06/17 1055 151 lb (68.5 kg)     Height 07/06/17 1055 5\' 3"  (1.6 m)     Head Circumference --      Peak Flow --      Pain Score 07/06/17 1054 5     Pain Loc --      Pain Edu? --      Excl. in GC? --    No data found.  Updated Vital Signs BP 121/79 (BP Location: Right Arm)   Pulse (!) 44   Temp 98.6 F (37 C) (Oral)   Ht 5\' 3"  (1.6 m)   Wt 151 lb (68.5 kg)   SpO2 98%   BMI 26.75 kg/m   Visual Acuity Right Eye Distance:   Left Eye Distance:   Bilateral Distance:    Right Eye Near:   Left Eye Near:    Bilateral Near:     Physical Exam  Constitutional: She is oriented to person, place, and time. She appears well-developed and well-nourished.  Non-toxic appearance. She does not appear ill.  HENT:  Head:  Normocephalic and atraumatic.  Right Ear: Tympanic membrane normal.  Left Ear: Tympanic membrane normal.  Nose: Nose normal.  Mouth/Throat: Uvula is midline, oropharynx is clear and moist and mucous membranes are normal.  Eyes: EOM are normal.  Neck: Normal range of motion. Neck supple.  No nuchal rigidity or meningeal signs.  Cardiovascular: Regular rhythm. Bradycardia present.  Pulmonary/Chest: Effort normal.  Musculoskeletal: Normal range of motion.  Neurological: She is alert and oriented to person, place, and time.  Skin: Skin is warm and dry. No rash noted.  Psychiatric: She has a normal mood and affect. Her behavior is normal.  Nursing note and vitals reviewed.    UC Treatments / Results  Labs (all labs ordered are listed, but only abnormal results are displayed) Labs Reviewed  STREP A DNA PROBE  POCT URINALYSIS DIP (MANUAL ENTRY)  POCT RAPID STREP A (OFFICE)    EKG None  Radiology No results found.  Procedures Procedures (including critical care time)  Medications Ordered in UC Medications - No data to display  Initial Impression / Assessment and Plan / UC Course  I have reviewed the triage vital signs and the nursing notes.  Pertinent labs & imaging results that were available during my care of the patient were reviewed by me and considered in my medical decision making (see chart for details).    No evidence of underlying bacterial infection on exam. Rapid strep: NEGATIVE Culture sent  Encouraged symptomatic treatment   Final Clinical Impressions(s) / UC Diagnoses   Final diagnoses:  Viral pharyngitis  Body aches     Discharge Instructions      You may take 500mg  acetaminophen every 4-6 hours or in combination with ibuprofen 400-600mg  every 6-8 hours as needed for pain, inflammation, and fever.  Be sure to drink at least eight 8oz glasses of water to stay well hydrated and get at least 8 hours of sleep at night, preferably more while sick.    Please follow up with family medicine in 1 week if not improving.     ED Prescriptions  None     Controlled Substance Prescriptions [place] Controlled Substance Registry consulted? Not Applicable   Rolla Plate 07/06/17 1455

## 2017-07-06 NOTE — Telephone Encounter (Signed)
NOTE MADE IN ERROR. NO KEFLEX SENT FOR THIS PATIENT.

## 2017-07-06 NOTE — Discharge Instructions (Signed)
°  You may take 500mg acetaminophen every 4-6 hours or in combination with ibuprofen 400-600mg every 6-8 hours as needed for pain, inflammation, and fever. ° °Be sure to drink at least eight 8oz glasses of water to stay well hydrated and get at least 8 hours of sleep at night, preferably more while sick.  ° °Please follow up with family medicine in 1 week if not improving.  °

## 2017-07-07 ENCOUNTER — Telehealth: Payer: Self-pay

## 2017-07-07 LAB — STREP A DNA PROBE: Group A Strep Probe: DETECTED — AB

## 2017-07-07 MED ORDER — AMOXICILLIN 500 MG PO CAPS: 500.0000 mg | ORAL_CAPSULE | Freq: Two times a day (BID) | ORAL | 0 refills | Status: AC

## 2017-07-07 NOTE — Telephone Encounter (Signed)
Pt notified of lab results and script being called to pharmacy.

## 2017-07-07 NOTE — Telephone Encounter (Signed)
[facility]

## 2017-07-13 ENCOUNTER — Inpatient Hospital Stay: Payer: BLUE CROSS/BLUE SHIELD | Attending: Genetic Counselor | Admitting: Genetics

## 2017-07-13 DIAGNOSIS — Z803 Family history of malignant neoplasm of breast: Secondary | ICD-10-CM | POA: Diagnosis not present

## 2017-07-13 DIAGNOSIS — Z8049 Family history of malignant neoplasm of other genital organs: Secondary | ICD-10-CM

## 2017-07-13 DIAGNOSIS — Z8051 Family history of malignant neoplasm of kidney: Secondary | ICD-10-CM

## 2017-07-13 DIAGNOSIS — Z808 Family history of malignant neoplasm of other organs or systems: Secondary | ICD-10-CM

## 2017-07-13 DIAGNOSIS — Z8041 Family history of malignant neoplasm of ovary: Secondary | ICD-10-CM | POA: Diagnosis not present

## 2017-07-15 ENCOUNTER — Encounter: Payer: Self-pay | Admitting: Genetics

## 2017-07-15 DIAGNOSIS — Z8049 Family history of malignant neoplasm of other genital organs: Secondary | ICD-10-CM | POA: Insufficient documentation

## 2017-07-15 DIAGNOSIS — Z808 Family history of malignant neoplasm of other organs or systems: Secondary | ICD-10-CM | POA: Insufficient documentation

## 2017-07-15 DIAGNOSIS — Z8051 Family history of malignant neoplasm of kidney: Secondary | ICD-10-CM | POA: Insufficient documentation

## 2017-07-15 DIAGNOSIS — Z8041 Family history of malignant neoplasm of ovary: Secondary | ICD-10-CM | POA: Insufficient documentation

## 2017-07-15 DIAGNOSIS — Z803 Family history of malignant neoplasm of breast: Secondary | ICD-10-CM | POA: Insufficient documentation

## 2017-07-15 NOTE — Progress Notes (Signed)
REFERRING PROVIDER: Charyl Bigger, MD [place], [place] [address]  PRIMARY PROVIDER:  Patient, No Pcp Per  PRIMARY REASON FOR VISIT:  1. Family history of breast cancer   2. Family history of uterine cancer   3. Family history of kidney cancer   4. Family history of ovarian cancer   5. Family history of brain cancer     HISTORY OF PRESENT ILLNESS:   [name], a 37 y.o. female, was seen for a [facility] cancer genetics consultation at the request of Dr. Murrell Schroeder due to a family history of cancer.  [name] presents to clinic today to discuss the possibility of a hereditary predisposition to cancer, genetic testing, and to further clarify her future cancer risks, as well as potential cancer risks for family members.   [name] is a 37 y.o. female with no personal history of cancer.  [name] reports she has cafe au lait spots and was referred to opthalmology and a genetics specialist to assess for Neurofibromatosis in her 54's.  She reports they did not feel she had NF and she did not meet clinical criteria for this condition.    HORMONAL RISK FACTORS:  Menarche was at age 12.  First live birth at age 79.  OCP use for approximately 20- some years have been IUD years.  Ovaries intact: yes.  Hysterectomy: no.  Menopausal status: premenopausal.  HRT use: 0 years. Colonoscopy: no; not examined. Mammogram within the last year: no. Had one in 2016 Number of breast biopsies: 0.  Past Medical History:  Diagnosis Date  . Anxiety   . Excessive sweating   . Family history of brain cancer   . Family history of breast cancer   . Family history of kidney cancer   . Family history of ovarian cancer   . Family history of uterine cancer   . Heart murmur   . Hormone disorder    Hypothyroid  . Hypertension     Past Surgical History:  Procedure Laterality Date  . REMOVAL OF HEMANGIOMA   1981  . TONSILLECTOMY  2006   TURBINATE REDUCTION  . WISDOM TOOTH EXTRACTION  2011     Social History   Socioeconomic History  . Marital status: Divorced    Spouse name: Not on file  . Number of children: Not on file  . Years of education: Not on file  . Highest education level: Not on file  Occupational History  . Not on file  Social Needs  . Financial resource strain: Not on file  . Food insecurity:    Worry: Not on file    Inability: Not on file  . Transportation needs:    Medical: Not on file    Non-medical: Not on file  Tobacco Use  . Smoking status: Never Smoker  . Smokeless tobacco: Never Used  Substance and Sexual Activity  . Alcohol use: Yes    Comment: 1-2x/week  . Drug use: No  . Sexual activity: Yes    Birth control/protection: IUD  Lifestyle  . Physical activity:    Days per week: Not on file    Minutes per session: Not on file  . Stress: Not on file  Relationships  . Social connections:    Talks on phone: Not on file    Gets together: Not on file    Attends religious service: Not on file    Active member of club or organization: Not on file    Attends meetings of  clubs or organizations: Not on file    Relationship status: Not on file  Other Topics Concern  . Not on file  Social History Narrative  . Not on file     FAMILY HISTORY:  We obtained a detailed, 4-generation family history.  Significant diagnoses are listed below: Family History  Problem Relation Age of Onset  . Diabetes Mother   . Breast cancer Mother 41  . Uterine cancer Mother 82  . Kidney cancer Father 23  . Heart failure Maternal Grandmother   . Stroke Maternal Grandmother   . Diabetes Maternal Grandmother   . Ovarian cancer Paternal Grandmother        dx in 64's, died in her 14's  . Brain cancer Other   . Cancer Cousin        type unk   [name] has a 60 year-old daughter with no history of cancer.  [name] has 1 bull brother who is 75 with no history of cancer.  [name] has 2 paternal half sisters in hteir 60's and 49's with no history of cancer.    [name] father: died at 63 due to kidney cancer dx at 22 Paternal Aunts/Uncles: 1 paternal aunt in her 76's with no history of cancer.  Paternal cousins: no history of cancer.  Paternal grandfather: died of a heart attack in mid-life.  This grandfather's brother (pateint's great uncle) had brain cancer.  This great uncle had a child who had cancer (type unk).  Paternal grandmother:died of ovarian cancer in her 33's.   [name] mother: dx with breast cancer at 43, she had chemo and radiation.  She does not think she had any hormone/antiestrogen treatment.  She developed uterine cancer at the age of 69, she is now in her late 61's.  Maternal Aunts/Uncles: 2 maternal aunts and 1 maternal uncle in their 72's/60's with no history of cancer.  Maternal cousins: no history of cancer.  Maternal grandfather: died in his 64's with no history of cancer.  Maternal grandmother:died in her 33's due to stroke/heart disease.   [name] is unaware of previous family history of genetic testing for hereditary cancer risks. Patient's maternal ancestors are of Scottish/English descent, and paternal ancestors are of [place] Jewish descent. There is no known consanguinity.  GENETIC COUNSELING ASSESSMENT: Mikayla Schroeder is a 37 y.o. female with a family history which is somewhat suggestive of a Hereditary Cancer Predisposition Syndrome. We, therefore, discussed and recommended the following at today's visit.   DISCUSSION: We reviewed the characteristics, features and inheritance patterns of hereditary cancer syndromes. We also discussed genetic testing, including the appropriate family members to test, the process of testing, insurance coverage and turn-around-time for results. We discussed the implications of a negative, positive and/or variant of uncertain significant result. We recommended [name] pursue genetic testing for the Multi-Cancer gene panel.   The Multi-Cancer Panel offered by [facility]  includes sequencing and/or deletion duplication testing of the following 83 genes: ALK, APC, ATM, AXIN2,BAP1,  BARD1, BLM, BMPR1A, BRCA1, BRCA2, BRIP1, CASR, CDC73, CDH1, CDK4, CDKN1B, CDKN1C, CDKN2A (p14ARF), CDKN2A (p16INK4a), CEBPA, CHEK2, CTNNA1, DICER1, DIS3L2, EGFR (c.2369C>T, p.Thr790Met variant only), EPCAM (Deletion/duplication testing only), FH, FLCN, GATA2, GPC3, GREM1 (Promoter region deletion/duplication testing only), HOXB13 (c.251G>A, p.Gly84Glu), HRAS, KIT, MAX, MEN1, MET, MITF (c.952G>A, p.Glu318Lys variant only), MLH1, MSH2, MSH3, MSH6, MUTYH, NBN, NF1, NF2, NTHL1, PALB2, PDGFRA, PHOX2B, PMS2, POLD1, POLE, POT1, PRKAR1A, PTCH1, PTEN, RAD50, RAD51C, RAD51D, RB1, RECQL4, RET, RUNX1, SDHAF2, SDHA (sequence changes only),  SDHB, SDHC, SDHD, SMAD4, SMARCA4, SMARCB1, SMARCE1, STK11, SUFU, TERC, TERT, TMEM127, TP53, TSC1, TSC2, VHL, WRN and WT1.   We discussed that only 5-10% of cancers are associated with a Hereditary cancer predisposition syndrome.  One of the most common hereditary cancer syndromes that increases breast/ovarian cancer risk is called Hereditary Breast and Ovarian Cancer (HBOC) syndrome.  This syndrome is caused by mutations in the BRCA1 and BRCA2 genes.  This syndrome increases an individual's lifetime risk to develop breast, ovarian, pancreatic, and other types of cancer.  There are also many other cancer predisposition syndromes caused by mutations in several other genes.  We discussed that if she is found to have a mutation in one of these genes, it may impact future medical management recommendations such as increased cancer screenings and consideration of risk reducing surgeries.  A positive result could also have implications for the patient's family members.  A Negative result would mean we did not identify a hereditary predisposition to cancer in her, but does not rule out the possibility of a hereditary predisposition to cancer.  There could be mutations that are  undetectable by current technology, or in genes not yet tested or identified to increase cancer risk.    We discussed the potential to find a Variant of Uncertain Significance or VUS.  These are variants that have not yet been identified as pathogenic or benign, and it is unknown if this variant is associated with increased cancer risk or if this is a normal finding.  Most VUS's are reclassified to benign or likely benign.   It should not be used to make medical management decisions. With time, we suspect the lab will determine the significance of any VUS's identified if any.   Based on Ms. Hogan's family history of cancer, she meets NCCN medical criteria for genetic testing. Despite that she meets criteria, she may still have an out of pocket cost. The laboratory can provide her with an estimate of her OOP cost.   Based on the patient's personal and family history, the statistical model (Tyrer Cusik)   Was used to estimate her risk of developing breast cancer. This estimates her lifetime risk of developing Breast cancer to be approximately 20.8%. This estimation is in the setting of negative genetic test results, a positive result may significantly impact this risk assessment.  The patient's lifetime breast cancer risk is a preliminary estimate based on available information using one of several models endorsed by the [place] (ACS). The ACS recommends consideration of breast MRI screening as an adjunct to mammography for patients at high risk (defined as 20% or greater lifetime risk). A more detailed breast cancer risk assessment can be considered, if clinically indicated.   [name] has been determined to be at high risk for breast cancer.  Therefore, we recommend that annual screening with mammography and breast MRI begin at age 64, or 10 years prior to the age of breast cancer diagnosis in a relative (whichever is earlier).  We discussed that [name] should discuss her individual  situation with her referring physician and determine a breast cancer screening plan with which they are both comfortable.      We discussed that some people do not want to undergo genetic testing due to fear of genetic discrimination.  A federal law called the Genetic Information Non-Discrimination Act (GINA) of 2008 helps protect individuals against genetic discrimination based on their genetic test results.  It impacts both health insurance and employment.  For  health insurance, it protects against increased premiums, being kicked off insurance or being forced to take a test in order to be insured.  For employment it protects against hiring, firing and promoting decisions based on genetic test results.  Health status due to a cancer diagnosis is not protected under GINA.  This law does not protect life insurance, disability insurance, or other types of insurance.   PLAN: After considering the risks, benefits, and limitations, [name]  provided informed consent to pursue genetic testing and the saliva sample was sent to [facility] for analysis of the Multi-Cancer Panel. Results should be available within approximately 2-3 weeks' time, at which point they will be disclosed by telephone to [name], as will any additional recommendations warranted by these results. [name] will receive a summary of her genetic counseling visit and a copy of her results once available. This information will also be available in Epic. We encouraged [name] to remain in contact with cancer genetics annually so that we can continuously update the family history and inform her of any changes in cancer genetics and testing that may be of benefit for her family. [name] questions were answered to her satisfaction today. Our contact information was provided should additional questions or concerns arise.  Based on Ms. Hogan's family history, we recommended her paternal relatives also have genetic counseling and  testing due to the family history of ovarian cancer. [name] will let us know if we can be of any assistance in coordinating genetic counseling and/or testing for this family member.   Lastly, we encouraged [name] to remain in contact with cancer genetics annually so that we can continuously update the family history and inform her of any changes in cancer genetics and testing that may be of benefit for this family.   Ms.  Mikayla Schroeder questions were answered to her satisfaction today. Our contact information was provided should additional questions or concerns arise. Thank you for the referral and allowing [place] to share in the care of your patient.   Tana Felts, MS, [facility] Certified Genetic Counselor [email or web address] phone: [phone]  The patient was seen for a total of 30 minutes in face-to-face genetic counseling.  The patient was accompanied today by her daughter, Mikayla Schroeder This patient was discussed with Drs. Magrinat, Lindi Adie and/or Burr Medico who agrees with the above.

## 2017-07-29 ENCOUNTER — Telehealth: Payer: Self-pay | Admitting: Genetics

## 2017-07-29 NOTE — Telephone Encounter (Signed)
Revealed negative genetic testing.  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   This normal result is reassuring and indicates that it is unlikely Mikayla Schroeder's has a hereditary cancer predisposition syndrome.  However, genetic testing is not perfect, and cannot definitively rule out a hereditary risk.  It will be important for her to keep in contact with genetics to learn if any additional testing may be needed in the future.    We discussed her [name] breast cancer risk estimate was just over 20% (20.8%).  Therefore, we recommend she have a discussion with her OBGYN regarding high risk breast screening (annual MRI and Mammo starting at 30).  We discussed this risk will change over time based on age, family history changes, hormonal changes, etc.  Therefore, it should repeated in the future to determine if she is still 'high risk'.

## 2017-08-12 ENCOUNTER — Encounter: Payer: Self-pay | Admitting: Genetics

## 2017-08-12 ENCOUNTER — Ambulatory Visit: Payer: Self-pay | Admitting: Genetics

## 2017-08-12 DIAGNOSIS — Z808 Family history of malignant neoplasm of other organs or systems: Secondary | ICD-10-CM

## 2017-08-12 DIAGNOSIS — Z1379 Encounter for other screening for genetic and chromosomal anomalies: Secondary | ICD-10-CM

## 2017-08-12 DIAGNOSIS — Z803 Family history of malignant neoplasm of breast: Secondary | ICD-10-CM

## 2017-08-12 DIAGNOSIS — Z8041 Family history of malignant neoplasm of ovary: Secondary | ICD-10-CM

## 2017-08-12 DIAGNOSIS — Z8049 Family history of malignant neoplasm of other genital organs: Secondary | ICD-10-CM

## 2017-08-12 DIAGNOSIS — Z8051 Family history of malignant neoplasm of kidney: Secondary | ICD-10-CM

## 2017-08-12 NOTE — Progress Notes (Signed)
HPI:  [name] was previously seen in the [facility] clinic on 07/13/2017 due to a family history of cancer and concerns regarding a hereditary predisposition to cancer. Please refer to our prior cancer genetics clinic note for more information regarding Mikayla Schroeder's medical, social and family histories, and our assessment and recommendations, at the time. [name] recent genetic test results were disclosed to her, as well as recommendations warranted by these results. These results and recommendations are discussed in more detail below.  CANCER HISTORY:   No history exists.     FAMILY HISTORY:  We obtained a detailed, 4-generation family history.  Significant diagnoses are listed below: Family History  Problem Relation Age of Onset  . Diabetes Mother   . Breast cancer Mother 65  . Uterine cancer Mother 83  . Kidney cancer Father 62  . Heart failure Maternal Grandmother   . Stroke Maternal Grandmother   . Diabetes Maternal Grandmother   . Ovarian cancer Paternal Grandmother        dx in 58's, died in her 7's  . Brain cancer Other   . Cancer Cousin        type unk    [name] has a 26 year-old daughter with no history of cancer.  [name] has 1 bull brother who is 22 with no history of cancer.  [name] has 2 paternal half sisters in hteir 22's and 54's with no history of cancer.   [name] father: died at 49 due to kidney cancer dx at 52 Paternal Aunts/Uncles: 1 paternal aunt in her 30's with no history of cancer.  Paternal cousins: no history of cancer.  Paternal grandfather: died of a heart attack in mid-life.  This grandfather's brother (pateint's great uncle) had brain cancer.  This great uncle had a child who had cancer (type unk).  Paternal grandmother:died of ovarian cancer in her 65's.   [name] mother: dx with breast cancer at 65, she had chemo and radiation.  She does not think she had any hormone/antiestrogen treatment.  She developed uterine  cancer at the age of 66, she is now in her late 26's.  Maternal Aunts/Uncles: 2 maternal aunts and 1 maternal uncle in their 67's/60's with no history of cancer.  Maternal cousins: no history of cancer.  Maternal grandfather: died in his 56's with no history of cancer.  Maternal grandmother:died in her 32's due to stroke/heart disease.   [name] is unaware of previous family history of genetic testing for hereditary cancer risks. Patient's maternal ancestors are of Scottish/English descent, and paternal ancestors are of [place] Jewish descent. There is no known consanguinity.  GENETIC TEST RESULTS: Genetic testing performed through [facility]'s Multi-Cancer Panel reported out on 07/24/2017 showed no pathogenic mutations. The   Multi-Cancer Panel offered by [facility] includes sequencing and/or deletion duplication testing of the following 83 genes: ALK, APC, ATM, AXIN2,BAP1,  BARD1, BLM, BMPR1A, BRCA1, BRCA2, BRIP1, CASR, CDC73, CDH1, CDK4, CDKN1B, CDKN1C, CDKN2A (p14ARF), CDKN2A (p16INK4a), CEBPA, CHEK2, CTNNA1, DICER1, DIS3L2, EGFR (c.2369C>T, p.Thr790Met variant only), EPCAM (Deletion/duplication testing only), FH, FLCN, GATA2, GPC3, GREM1 (Promoter region deletion/duplication testing only), HOXB13 (c.251G>A, p.Gly84Glu), HRAS, KIT, MAX, MEN1, MET, MITF (c.952G>A, p.Glu318Lys variant only), MLH1, MSH2, MSH3, MSH6, MUTYH, NBN, NF1, NF2, NTHL1, PALB2, PDGFRA, PHOX2B, PMS2, POLD1, POLE, POT1, PRKAR1A, PTCH1, PTEN, RAD50, RAD51C, RAD51D, RB1, RECQL4, RET, RUNX1, SDHAF2, SDHA (sequence changes only), SDHB, SDHC, SDHD, SMAD4, SMARCA4, SMARCB1, SMARCE1, STK11, SUFU, TERC, TERT, TMEM127, TP53, TSC1, TSC2, VHL, WRN  and WT1. .  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 c.753C>G (p.Ile251Met)  The test report will be scanned into EPIC and will be located under the Molecular Pathology section of the Results Review tab. A portion of the result report is included below  for reference.     We discussed with [name] that because current genetic testing is not perfect, it is possible there may be a gene mutation in one of these genes that current testing cannot detect, but that chance is small.  We also discussed, that there could be another gene that has not yet been discovered, or that we have not yet tested, that is responsible for the cancer diagnoses in the family. It is also possible there is a hereditary cause for the cancer in the family that [name] did not inherit and therefore was not identified in her testing.  Therefore, it is important to remain in touch with cancer genetics in the future so that we can continue to offer [name] the most up to date genetic testing.   Regarding the VUS in TP53: At this time, it is unknown if this variant is associated with increased cancer risk or if this is a normal finding, but most variants such as this get reclassified to being inconsequential. It should not be used to make medical management decisions. With time, we suspect the lab will determine the significance of this variant, if any. If we do learn more about it, we will try to contact [name] to discuss it further. However, it is important to stay in touch with [place] periodically and keep the address and phone number up to date.  ADDITIONAL GENETIC TESTING: We discussed with [name] that her genetic testing was fairly extensive.  If there are are genes identified to increase cancer risk that can be analyzed in the future, we would be happy to discuss and coordinate this testing at that time.      CANCER SCREENING RECOMMENDATIONS: [name] test result is considered negative (normal).  This means that we have not identified a hereditary cause for her family history of cancer at this time.   While reassuring, this does not definitively rule out a hereditary predisposition to cancer. It is still possible that there could be genetic mutations that are  undetectable by current technology, or genetic mutations in genes that have not been tested or identified to increase cancer risk.  Therefore, it is recommended she continue to follow the cancer management and screening guidelines provided by her oncology and primary healthcare provider. An individual's cancer risk is not determined by genetic test results alone.  Overall cancer risk assessment includes additional factors such as personal medical history, family history, etc.  These should be used to make a personalized plan for cancer prevention and surveillance.    Based on the patient's personal and family history, the statistical model (Tyrer Cusik)   Was used to estimate her risk of developing breast cancer. This estimates her lifetime risk of developing Breast cancer to be approximately 20.8%. The patient's lifetime breast cancer risk is a preliminary estimate based on available information using one of several models endorsed by the [place] (ACS). The ACS recommends consideration of breast MRI screening as an adjunct to mammography for patients at high risk (defined as 20% or greater lifetime risk). A more detailed breast cancer risk assessment can be considered, if clinically indicated.   [name] has  been determined to be at high risk for breast cancer. Therefore, we recommend that annual screening with mammography and breast MRI begin at age 24, or 10 years prior to the age of breast cancer diagnosis in a relative (whichever is earlier). We discussed that [name] should discuss her individual situation with her referring physician and determine a breast cancer screening plan with which they are both comfortable.  This estimate should be repeated in the future, as her risk may change over time due to age, hormonal factors, etc.      RECOMMENDATIONS FOR FAMILY MEMBERS:  Relatives in this family might be at some increased risk of developing cancer, over the general population  risk, simply due to the family history of cancer.  We recommended women in this family have a yearly mammogram beginning at age 14, or 44 years younger than the earliest onset of cancer, an annual clinical breast exam, and perform monthly breast self-exams. Women in this family should also have a gynecological exam as recommended by their primary provider. All family members should have a colonoscopy by age 93 (or as directed by their doctors).  All family members should inform their physicians about the family history of cancer so their doctors can make the most appropriate screening recommendations for them.   It is also possible there is a hereditary cause for the cancer in Mikayla Schroeder's family that she did not inherit and therefore was not identified in her.  Therefore, we recommended relatives on her father's side of the family also, have genetic counseling and testing. [name] will let us know if we can be of any assistance in coordinating genetic counseling and/or testing for these family members.   FOLLOW-UP: Lastly, we discussed with [name] that cancer genetics is a rapidly advancing field and it is possible that new genetic tests will be appropriate for her and/or her family members in the future. We encouraged her to remain in contact with cancer genetics on an annual basis so we can update her personal and family histories and let her know of advances in cancer genetics that may benefit this family.   Our contact number was provided. [name] questions were answered to her satisfaction, and she knows she is welcome to call us at anytime with additional questions or concerns.   Ferol Luz, MS, [facility] Certified Genetic Counselor [email or web address]

## 2019-06-25 ENCOUNTER — Emergency Department (INDEPENDENT_AMBULATORY_CARE_PROVIDER_SITE_OTHER)
Admission: EM | Admit: 2019-06-25 | Discharge: 2019-06-25 | Disposition: A | Discharge status: Home / Self Care | Payer: BC Managed Care – PPO | Source: Home / Self Care

## 2019-06-25 ENCOUNTER — Encounter: Payer: Self-pay | Admitting: Emergency Medicine

## 2019-06-25 ENCOUNTER — Other Ambulatory Visit: Payer: Self-pay

## 2019-06-25 ENCOUNTER — Ambulatory Visit: Payer: Self-pay

## 2019-06-25 DIAGNOSIS — H60392 Other infective otitis externa, left ear: Secondary | ICD-10-CM

## 2019-06-25 MED ORDER — NEOMYCIN-POLYMYXIN-HC 3.5-10000-1 OT SUSP: 4.0000 [drp] | Freq: Three times a day (TID) | OTIC | 0 refills | Status: AC

## 2019-06-25 MED ORDER — NEOMYCIN-POLYMYXIN-HC 3.5-10000-1 OT SUSP: 4.0000 [drp] | Freq: Three times a day (TID) | OTIC | 0 refills | Status: DC

## 2019-06-25 NOTE — Discharge Instructions (Signed)
  Please use antibiotics as prescribed and be sure to complete entire course even if you start to feel better to ensure infection does not come back.  Follow up with family medicine in 4-5 days if not improving, sooner  if worsening.

## 2019-06-25 NOTE — ED Triage Notes (Signed)
Patient c/o left ear pain x 1 day, no drainage, better with Advil, pain radiates down to jaw.

## 2019-06-25 NOTE — ED Provider Notes (Signed)
[facility]    CSN: [id number] Arrival date & time: 06/25/19  1511      History   Chief Complaint Chief Complaint  Patient presents with  . Otalgia    HPI Mikayla Schroeder is a 39 y.o. female.   HPI  Mikayla Schroeder is a 39 y.o. female presenting to UC with c/o Left ear pain that started yesterday.  She feels like it is swollen, aching and sore, worse with touch, radiates into her neck. Improves with Advil. She did go swimming in a pool about a week ago. Denies fever, chills, cough, congestion, HA or dizziness.    Past Medical History:  Diagnosis Date  . Anxiety   . Excessive sweating   . Family history of brain cancer   . Family history of breast cancer   . Family history of kidney cancer   . Family history of ovarian cancer   . Family history of uterine cancer   . Heart murmur   . Hormone disorder    Hypothyroid  . Hypertension     Patient Active Problem List   Diagnosis Date Noted  . Genetic testing 08/12/2017  . Family history of breast cancer   . Family history of uterine cancer   . Family history of kidney cancer   . Family history of ovarian cancer   . Family history of brain cancer   . Anxiety   . Hormone disorder     Past Surgical History:  Procedure Laterality Date  . REMOVAL OF HEMANGIOMA   1981  . TONSILLECTOMY  2006   TURBINATE REDUCTION  . WISDOM TOOTH EXTRACTION  2011    OB History    Gravida  1   Para  1   Term  1   Preterm      AB      Living  1     SAB      TAB      Ectopic      Multiple      Live Births  1            Home Medications    Prior to Admission medications   Medication Sig Start Date End Date Taking? Authorizing Provider  ibuprofen (ADVIL,MOTRIN) 600 MG tablet Take 1 tablet (600 mg total) by mouth every 6 (six) hours as needed for pain. 01/04/12  Yes Earl Gala, CNM  NIFEdipine (PROCARDIA-XL/ADALAT-CC/NIFEDICAL-XL) 30 MG 24 hr tablet Take 1 tablet (30 mg total) by mouth daily.  01/13/12  Yes Noland Fordyce, MD  sertraline (ZOLOFT) 50 MG tablet Take 50 mg by mouth daily.   Yes [provider]  levonorgestrel (MIRENA) 20 MCG/24HR IUD 1 each by Intrauterine route once.    [provider]  neomycin-polymyxin-hydrocortisone (CORTISPORIN) 3.5-10000-1 OTIC suspension Place 4 drops into the left ear 3 (three) times daily for 7 days. 06/25/19 07/02/19  Lurene Shadow, PA-C    Family History Family History  Problem Relation Age of Onset  . Diabetes Mother   . Breast cancer Mother 81  . Uterine cancer Mother 61  . Kidney cancer Father 41  . Heart failure Maternal Grandmother   . Stroke Maternal Grandmother   . Diabetes Maternal Grandmother   . Ovarian cancer Paternal Grandmother        dx in 4's, died in her 60's  . Brain cancer Other   . Cancer Cousin        type unk    Social History Social History  Tobacco Use  . Smoking status: Never Smoker  . Smokeless tobacco: Never Used  Substance Use Topics  . Alcohol use: Yes    Comment: 1-2x/week  . Drug use: No     Allergies   Patient has no known allergies.   Review of Systems Review of Systems  Constitutional: Negative for chills and fever.  HENT: Positive for ear pain (Left). Negative for congestion, sore throat, trouble swallowing and voice change.   Respiratory: Negative for cough and shortness of breath.   Cardiovascular: Negative for chest pain and palpitations.  Gastrointestinal: Negative for abdominal pain, diarrhea, nausea and vomiting.  Musculoskeletal: Negative for arthralgias, back pain and myalgias.  Skin: Negative for rash.  All other systems reviewed and are negative.    Physical Exam Triage Vital Signs ED Triage Vitals  Enc Vitals Group     BP 06/25/19 1540 (!) 143/82     Pulse Rate 06/25/19 1540 (!) 50     Resp --      Temp 06/25/19 1540 98.6 F (37 C)     Temp Source 06/25/19 1540 Oral     SpO2 06/25/19 1540 100 %     Weight --      Height --      Head  Circumference --      Peak Flow --      Pain Score 06/25/19 1542 7     Pain Loc --      Pain Edu? --      Excl. in GC? --    No data found.  Updated Vital Signs BP (!) 143/82 (BP Location: Left Arm)   Pulse (!) 50   Temp 98.6 F (37 C) (Oral)   LMP 06/22/2019 Comment: trying to get pregnant  SpO2 100%   Visual Acuity Right Eye Distance:   Left Eye Distance:   Bilateral Distance:    Right Eye Near:   Left Eye Near:    Bilateral Near:     Physical Exam Vitals and nursing note reviewed.  Constitutional:      General: She is not in acute distress.    Appearance: Normal appearance. She is well-developed. She is not ill-appearing, toxic-appearing or diaphoretic.  HENT:     Head: Normocephalic and atraumatic.     Right Ear: Tympanic membrane and ear canal normal.     Left Ear: Swelling and tenderness present. No drainage. No mastoid tenderness. Tympanic membrane is not erythematous or bulging.     Nose: Nose normal.     Right Sinus: No maxillary sinus tenderness or frontal sinus tenderness.     Left Sinus: No maxillary sinus tenderness or frontal sinus tenderness.     Mouth/Throat:     Lips: Pink.     Mouth: Mucous membranes are moist.     Pharynx: Oropharynx is clear. Uvula midline. No pharyngeal swelling, oropharyngeal exudate, posterior oropharyngeal erythema or uvula swelling.  Cardiovascular:     Rate and Rhythm: Normal rate and regular rhythm.  Pulmonary:     Effort: Pulmonary effort is normal. No respiratory distress.     Breath sounds: Normal breath sounds. No stridor. No wheezing, rhonchi or rales.  Musculoskeletal:        General: Normal range of motion.     Cervical back: Normal range of motion and neck supple.  Lymphadenopathy:     Cervical: No cervical adenopathy.  Skin:    General: Skin is warm and dry.  Neurological:     Mental Status: She is alert  and oriented to person, place, and time.  Psychiatric:        Behavior: Behavior normal.      UC  Treatments / Results  Labs (all labs ordered are listed, but only abnormal results are displayed) Labs Reviewed - No data to display  EKG   Radiology No results found.  Procedures Procedures (including critical care time)  Medications Ordered in UC Medications - No data to display  Initial Impression / Assessment and Plan / UC Course  I have reviewed the triage vital signs and the nursing notes.  Pertinent labs & imaging results that were available during my care of the patient were reviewed by me and considered in my medical decision making (see chart for details).     Hx and exam c/w Left otitis externa Will tx with cortisporin drops AVS given  Final Clinical Impressions(s) / UC Diagnoses   Final diagnoses:  Infective otitis externa, left     Discharge Instructions      Please use antibiotics as prescribed and be sure to complete entire course even if you start to feel better to ensure infection does not come back.  Follow up with family medicine in 4-5 days if not improving, sooner  if worsening.      ED Prescriptions    Medication Sig Dispense Auth. Provider   neomycin-polymyxin-hydrocortisone (CORTISPORIN) 3.5-10000-1 OTIC suspension  (Status: Discontinued) Place 4 drops into the left ear 3 (three) times daily for 7 days. 10 mL Gerarda Fraction, Elih Mooney O, PA-C   neomycin-polymyxin-hydrocortisone (CORTISPORIN) 3.5-10000-1 OTIC suspension Place 4 drops into the left ear 3 (three) times daily for 7 days. 10 mL Noe Gens, PA-C     PDMP not reviewed this encounter.   Noe Gens, [place] 06/27/19 [phone]

## 2019-07-01 ENCOUNTER — Telehealth: Payer: Self-pay | Admitting: Emergency Medicine

## 2019-07-01 NOTE — Telephone Encounter (Signed)
Patient calling to ask about her ear still feeling "full". Patient confirmed that she has been using gtss prescribed and will finish on 07/02/19. Per AVS patient was to follow up w/ her PCP in the ear pain was worse in 4-5 days. Patient verbalized an understanding and will follow up w/ her PCP next week or follow up here this weekend in "fullness" continues

## 2019-07-14 ENCOUNTER — Other Ambulatory Visit: Payer: Self-pay

## 2019-07-14 ENCOUNTER — Emergency Department (INDEPENDENT_AMBULATORY_CARE_PROVIDER_SITE_OTHER)
Admission: RE | Admit: 2019-07-14 | Discharge: 2019-07-14 | Disposition: A | Discharge status: Home / Self Care | Payer: BC Managed Care – PPO | Source: Ambulatory Visit

## 2019-07-14 VITALS — BP 126/85 | HR 59 | Temp 98.7°F

## 2019-07-14 DIAGNOSIS — J029 Acute pharyngitis, unspecified: Secondary | ICD-10-CM

## 2019-07-14 LAB — POCT RAPID STREP A (OFFICE): Rapid Strep A Screen: NEGATIVE

## 2019-07-14 NOTE — Discharge Instructions (Signed)
  You may take 500mg acetaminophen every 4-6 hours or in combination with ibuprofen 400-600mg every 6-8 hours as needed for pain, inflammation, and fever.  Be sure to well hydrated with clear liquids and get at least 8 hours of sleep at night, preferably more while sick.   Please follow up with family medicine in 1 week if needed.   

## 2019-07-14 NOTE — ED Triage Notes (Addendum)
Pt recently treated for ear infection- still using drops - woke up w/ sore throat today - concerned about strep No fever at home Was on ear gtts x 5 days per UC - went to see PCP who told pt to continue w/ gtts for another 5 days - today is the last day Pt is going to [facility] for vacation & wanted to r/o strep COVID vaccine March 2021 Moderna Pt on laptop during triage - reminded to turn off once provider cam into the room - pt verbalized an understanding

## 2019-07-14 NOTE — ED Provider Notes (Signed)
[facility]    CSN: [id number] Arrival date & time: 07/14/19  1315      History   Chief Complaint Chief Complaint  Patient presents with  . Appointment  . Sore Throat    HPI Mikayla Schroeder is a 39 y.o. female.   HPI Mikayla Schroeder is a 39 y.o. female presenting to UC with c/o mild sore throat that started this morning. She is currently being treated for Left otitis externa with ear drops. The ear pain has resolved but she is concerned she may have strep throat now.  Denies fever, chills, n/v/d. She leaves for a trip tomorrow and wants to make sure she does not need additional antibiotics.   Past Medical History:  Diagnosis Date  . Anxiety   . Excessive sweating   . Family history of brain cancer   . Family history of breast cancer   . Family history of kidney cancer   . Family history of ovarian cancer   . Family history of uterine cancer   . Heart murmur   . Hormone disorder    Hypothyroid  . Hypertension     Patient Active Problem List   Diagnosis Date Noted  . Genetic testing 08/12/2017  . Family history of breast cancer   . Family history of uterine cancer   . Family history of kidney cancer   . Family history of ovarian cancer   . Family history of brain cancer   . Anxiety   . Hormone disorder     Past Surgical History:  Procedure Laterality Date  . REMOVAL OF HEMANGIOMA   1981  . TONSILLECTOMY  2006   TURBINATE REDUCTION  . WISDOM TOOTH EXTRACTION  2011    OB History    Gravida  1   Para  1   Term  1   Preterm      AB      Living  1     SAB      TAB      Ectopic      Multiple      Live Births  1            Home Medications    Prior to Admission medications   Medication Sig Start Date End Date Taking? Authorizing Provider  neomycin-polymyxin-hydrocortisone (CORTISPORIN) 3.5-10000-1 OTIC suspension  06/25/19  Yes [provider]  NIFEdipine (PROCARDIA-XL/ADALAT-CC/NIFEDICAL-XL) 30 MG 24 hr tablet  Take 1 tablet (30 mg total) by mouth daily. 01/13/12  Yes Noland Fordyce, MD  sertraline (ZOLOFT) 50 MG tablet Take 50 mg by mouth daily.   Yes [provider]  ibuprofen (ADVIL,MOTRIN) 600 MG tablet Take 1 tablet (600 mg total) by mouth every 6 (six) hours as needed for pain. 01/04/12   Earl Gala, CNM  levonorgestrel (MIRENA) 20 MCG/24HR IUD 1 each by Intrauterine route once.    [provider]    Family History Family History  Problem Relation Age of Onset  . Diabetes Mother   . Breast cancer Mother 57  . Uterine cancer Mother 86  . Kidney cancer Father 8  . Heart failure Maternal Grandmother   . Stroke Maternal Grandmother   . Diabetes Maternal Grandmother   . Ovarian cancer Paternal Grandmother        dx in 57's, died in her 37's  . Brain cancer Other   . Cancer Cousin        type unk    Social History Social History  Tobacco Use  . Smoking status: Never Smoker  . Smokeless tobacco: Never Used  Substance Use Topics  . Alcohol use: Yes    Comment: 1-2x/week  . Drug use: No     Allergies   Patient has no known allergies.   Review of Systems Review of Systems  Constitutional: Negative for chills and fever.  HENT: Positive for congestion (minimal), hearing loss (Left ear fullness), postnasal drip and sore throat. Negative for ear pain, trouble swallowing and voice change.   Respiratory: Positive for cough (minimal). Negative for shortness of breath.   Cardiovascular: Negative for chest pain and palpitations.  Gastrointestinal: Negative for abdominal pain, diarrhea, nausea and vomiting.  Musculoskeletal: Negative for arthralgias, back pain and myalgias.  Skin: Negative for rash.  Neurological: Negative for dizziness and headaches.  All other systems reviewed and are negative.    Physical Exam Triage Vital Signs ED Triage Vitals  Enc Vitals Group     BP      Pulse      Resp      Temp      Temp src      SpO2      Weight       Height      Head Circumference      Peak Flow      Pain Score      Pain Loc      Pain Edu?      Excl. in GC?    No data found.  Updated Vital Signs BP 126/85 (BP Location: Right Arm)   Pulse (!) 59   Temp 98.7 F (37.1 C) (Oral)   LMP 06/22/2019 Comment: trying to get pregnant  SpO2 99%   Visual Acuity Right Eye Distance:   Left Eye Distance:   Bilateral Distance:    Right Eye Near:   Left Eye Near:    Bilateral Near:     Physical Exam Vitals and nursing note reviewed.  Constitutional:      Appearance: She is well-developed.  HENT:     Head: Normocephalic and atraumatic.     Right Ear: Tympanic membrane and ear canal normal.     Left Ear: Tympanic membrane and ear canal normal.     Nose: Nose normal.     Mouth/Throat:     Lips: Pink.     Mouth: Mucous membranes are moist.     Pharynx: Oropharynx is clear. Uvula midline. Posterior oropharyngeal erythema ( minimal) present. No pharyngeal swelling, oropharyngeal exudate or uvula swelling.  Cardiovascular:     Rate and Rhythm: Normal rate and regular rhythm.  Pulmonary:     Effort: Pulmonary effort is normal. No respiratory distress.     Breath sounds: Normal breath sounds. No stridor. No wheezing, rhonchi or rales.  Musculoskeletal:        General: Normal range of motion.     Cervical back: Normal range of motion and neck supple.  Lymphadenopathy:     Cervical: No cervical adenopathy.  Skin:    General: Skin is warm and dry.  Neurological:     Mental Status: She is alert and oriented to person, place, and time.  Psychiatric:        Behavior: Behavior normal.      UC Treatments / Results  Labs (all labs ordered are listed, but only abnormal results are displayed) Labs Reviewed  STREP A DNA PROBE  POCT RAPID STREP A (OFFICE)    EKG   Radiology No results found.  Procedures Procedures (including critical care time)  Medications Ordered in UC Medications - No data to display  Initial Impression  / Assessment and Plan / UC Course  I have reviewed the triage vital signs and the nursing notes.  Pertinent labs & imaging results that were available during my care of the patient were reviewed by me and considered in my medical decision making (see chart for details).     Strep unlikely, pt insistent on strep test  Rapid strep: NEGATIVE  Encouraged symptomatic tx F/u with PCP as needed AVS given  Final Clinical Impressions(s) / UC Diagnoses   Final diagnoses:  Acute pharyngitis, unspecified etiology     Discharge Instructions      You may take 500mg  acetaminophen every 4-6 hours or in combination with ibuprofen 400-600mg  every 6-8 hours as needed for pain, inflammation, and fever.  Be sure to well hydrated with clear liquids and get at least 8 hours of sleep at night, preferably more while sick.   Please follow up with family medicine in 1 week if needed.     ED Prescriptions    None     PDMP not reviewed this encounter.   , Lurene Shadow 07/15/19 [phone]

## 2019-07-15 LAB — STREP A DNA PROBE: Group A Strep Probe: NOT DETECTED

## 2019-08-12 DIAGNOSIS — G4482 Headache associated with sexual activity: Secondary | ICD-10-CM | POA: Insufficient documentation

## 2019-09-30 LAB — OB RESULTS CONSOLE HEPATITIS B SURFACE ANTIGEN: Hepatitis B Surface Ag: NEGATIVE

## 2019-09-30 LAB — OB RESULTS CONSOLE HIV ANTIBODY (ROUTINE TESTING): HIV: NONREACTIVE

## 2019-09-30 LAB — OB RESULTS CONSOLE GC/CHLAMYDIA
Chlamydia: NEGATIVE
Gonorrhea: NEGATIVE

## 2019-09-30 LAB — OB RESULTS CONSOLE RPR: RPR: NONREACTIVE

## 2020-03-27 LAB — OB RESULTS CONSOLE GBS: GBS: POSITIVE

## 2020-04-13 ENCOUNTER — Other Ambulatory Visit: Payer: Self-pay | Admitting: Obstetrics and Gynecology

## 2020-04-15 ENCOUNTER — Inpatient Hospital Stay (HOSPITAL_COMMUNITY)
Admission: AD | Admit: 2020-04-15 | Discharge: 2020-04-18 | DRG: 787 | Disposition: A | Discharge status: Home / Self Care | Payer: BC Managed Care – PPO | Attending: Obstetrics and Gynecology | Admitting: Obstetrics and Gynecology

## 2020-04-15 ENCOUNTER — Inpatient Hospital Stay (HOSPITAL_COMMUNITY): Payer: BC Managed Care – PPO

## 2020-04-15 ENCOUNTER — Inpatient Hospital Stay (HOSPITAL_COMMUNITY): Payer: BC Managed Care – PPO | Admitting: Anesthesiology

## 2020-04-15 ENCOUNTER — Other Ambulatory Visit: Payer: Self-pay

## 2020-04-15 ENCOUNTER — Encounter (HOSPITAL_COMMUNITY): Payer: Self-pay | Admitting: Obstetrics and Gynecology

## 2020-04-15 DIAGNOSIS — F32A Depression, unspecified: Secondary | ICD-10-CM | POA: Diagnosis present

## 2020-04-15 DIAGNOSIS — Z20822 Contact with and (suspected) exposure to covid-19: Secondary | ICD-10-CM | POA: Diagnosis present

## 2020-04-15 DIAGNOSIS — Z3490 Encounter for supervision of normal pregnancy, unspecified, unspecified trimester: Secondary | ICD-10-CM | POA: Diagnosis present

## 2020-04-15 DIAGNOSIS — O99824 Streptococcus B carrier state complicating childbirth: Secondary | ICD-10-CM | POA: Diagnosis present

## 2020-04-15 DIAGNOSIS — O1002 Pre-existing essential hypertension complicating childbirth: Principal | ICD-10-CM | POA: Diagnosis present

## 2020-04-15 DIAGNOSIS — Z3A39 39 weeks gestation of pregnancy: Secondary | ICD-10-CM

## 2020-04-15 DIAGNOSIS — O9902 Anemia complicating childbirth: Secondary | ICD-10-CM | POA: Diagnosis present

## 2020-04-15 DIAGNOSIS — D62 Acute posthemorrhagic anemia: Secondary | ICD-10-CM | POA: Diagnosis not present

## 2020-04-15 DIAGNOSIS — O99344 Other mental disorders complicating childbirth: Secondary | ICD-10-CM | POA: Diagnosis present

## 2020-04-15 DIAGNOSIS — F419 Anxiety disorder, unspecified: Secondary | ICD-10-CM | POA: Diagnosis present

## 2020-04-15 DIAGNOSIS — O9081 Anemia of the puerperium: Secondary | ICD-10-CM | POA: Diagnosis not present

## 2020-04-15 DIAGNOSIS — Z98891 History of uterine scar from previous surgery: Secondary | ICD-10-CM

## 2020-04-15 DIAGNOSIS — I10 Essential (primary) hypertension: Secondary | ICD-10-CM | POA: Diagnosis present

## 2020-04-15 LAB — COMPREHENSIVE METABOLIC PANEL
ALT: 17 U/L (ref 0–44)
AST: 21 U/L (ref 15–41)
Albumin: 2.8 g/dL — ABNORMAL LOW (ref 3.5–5.0)
Alkaline Phosphatase: 109 U/L (ref 38–126)
Anion gap: 7 (ref 5–15)
BUN: 8 mg/dL (ref 6–20)
CO2: 21 mmol/L — ABNORMAL LOW (ref 22–32)
Calcium: 8.8 mg/dL — ABNORMAL LOW (ref 8.9–10.3)
Chloride: 108 mmol/L (ref 98–111)
Creatinine, Ser: 0.51 mg/dL (ref 0.44–1.00)
GFR, Estimated: >60 mL/min (ref >60)
Glucose, Bld: 111 mg/dL — ABNORMAL HIGH (ref 70–99)
Potassium: 3.6 mmol/L (ref 3.5–5.1)
Sodium: 136 mmol/L (ref 135–145)
Total Bilirubin: 0.6 mg/dL (ref 0.3–1.2)
Total Protein: 5.9 g/dL — ABNORMAL LOW (ref 6.5–8.1)

## 2020-04-15 LAB — URIC ACID: Uric Acid, Serum: 4.6 mg/dL (ref 2.5–7.1)

## 2020-04-15 LAB — CBC
HCT: 30.5 % — ABNORMAL LOW (ref 36.0–46.0)
Hemoglobin: 10.8 g/dL — ABNORMAL LOW (ref 12.0–15.0)
MCH: 32.1 pg (ref 26.0–34.0)
MCHC: 35.4 g/dL (ref 30.0–36.0)
MCV: 90.8 fL (ref 80.0–100.0)
Platelets: 154 10*3/uL (ref 150–400)
RBC: 3.36 MIL/uL — ABNORMAL LOW (ref 3.87–5.11)
RDW: 12.3 % (ref 11.5–15.5)
WBC: 9.6 10*3/uL (ref 4.0–10.5)
nRBC: 0 % (ref 0.0–0.2)

## 2020-04-15 LAB — RESP PANEL BY RT-PCR (FLU A&B, COVID) ARPGX2
Influenza A by PCR: NEGATIVE
Influenza B by PCR: NEGATIVE
SARS Coronavirus 2 by RT PCR: NEGATIVE

## 2020-04-15 LAB — PROTEIN / CREATININE RATIO, URINE
Creatinine, Urine: 42.61 mg/dL
Total Protein, Urine: <6 mg/dL

## 2020-04-15 LAB — TYPE AND SCREEN
ABO/RH(D): O POS
Antibody Screen: NEGATIVE

## 2020-04-15 MED ORDER — EPHEDRINE 5 MG/ML INJ: 10.0000 mg | INTRAVENOUS | Status: DC | PRN

## 2020-04-15 MED ORDER — TERBUTALINE SULFATE 1 MG/ML IJ SOLN
INTRAMUSCULAR | Status: AC
  Administered 2020-04-15: 0.25 mg via SUBCUTANEOUS
  Filled 2020-04-15: qty 1

## 2020-04-15 MED ORDER — OXYTOCIN BOLUS FROM INFUSION: 333.0000 mL | Freq: Once | INTRAVENOUS | Status: DC

## 2020-04-15 MED ORDER — PHENYLEPHRINE 40 MCG/ML (10ML) SYRINGE FOR IV PUSH (FOR BLOOD PRESSURE SUPPORT): 80.0000 ug | PREFILLED_SYRINGE | INTRAVENOUS | Status: DC | PRN

## 2020-04-15 MED ORDER — ACETAMINOPHEN 325 MG PO TABS: 650.0000 mg | ORAL_TABLET | ORAL | Status: DC | PRN

## 2020-04-15 MED ORDER — SODIUM CHLORIDE 0.9 % IV SOLN
5.0000 10*6.[IU] | Freq: Once | INTRAVENOUS | Status: AC
  Administered 2020-04-15: 5 10*6.[IU] via INTRAVENOUS
  Filled 2020-04-15: qty 5

## 2020-04-15 MED ORDER — TERBUTALINE SULFATE 1 MG/ML IJ SOLN: 0.2500 mg | Freq: Once | INTRAMUSCULAR | Status: AC | PRN

## 2020-04-15 MED ORDER — SOD CITRATE-CITRIC ACID 500-334 MG/5ML PO SOLN
30.0000 mL | ORAL | Status: DC | PRN
  Administered 2020-04-16: 30 mL via ORAL
  Filled 2020-04-15: qty 15

## 2020-04-15 MED ORDER — PENICILLIN G POT IN DEXTROSE 60000 UNIT/ML IV SOLN
3.0000 10*6.[IU] | INTRAVENOUS | Status: DC
  Administered 2020-04-15 – 2020-04-16 (×4): 3 10*6.[IU] via INTRAVENOUS
  Filled 2020-04-15 (×4): qty 50

## 2020-04-15 MED ORDER — ONDANSETRON HCL 4 MG/2ML IJ SOLN
4.0000 mg | Freq: Four times a day (QID) | INTRAMUSCULAR | Status: DC | PRN
  Administered 2020-04-16: 4 mg via INTRAVENOUS
  Filled 2020-04-15: qty 2

## 2020-04-15 MED ORDER — SERTRALINE HCL 50 MG PO TABS
50.0000 mg | ORAL_TABLET | Freq: Every day | ORAL | Status: DC
  Filled 2020-04-15 (×2): qty 1

## 2020-04-15 MED ORDER — OXYTOCIN-SODIUM CHLORIDE 30-0.9 UT/500ML-% IV SOLN: 2.5000 [IU]/h | INTRAVENOUS | Status: DC

## 2020-04-15 MED ORDER — LACTATED RINGERS IV SOLN
500.0000 mL | Freq: Once | INTRAVENOUS | Status: AC
  Administered 2020-04-15: 500 mL via INTRAVENOUS

## 2020-04-15 MED ORDER — LIDOCAINE HCL (PF) 1 % IJ SOLN
INTRAMUSCULAR | Status: DC | PRN
  Administered 2020-04-15: 10 mL via EPIDURAL

## 2020-04-15 MED ORDER — FENTANYL-BUPIVACAINE-NACL 0.5-0.125-0.9 MG/250ML-% EP SOLN
12.0000 mL/h | EPIDURAL | Status: DC | PRN
  Administered 2020-04-15: 14 mL/h via EPIDURAL
  Filled 2020-04-15: qty 250

## 2020-04-15 MED ORDER — OXYTOCIN-SODIUM CHLORIDE 30-0.9 UT/500ML-% IV SOLN
1.0000 m[IU]/min | INTRAVENOUS | Status: DC
  Administered 2020-04-15 – 2020-04-16 (×2): 2 m[IU]/min via INTRAVENOUS
  Filled 2020-04-15 (×2): qty 500

## 2020-04-15 MED ORDER — LACTATED RINGERS IV SOLN: INTRAVENOUS | Status: DC

## 2020-04-15 MED ORDER — LACTATED RINGERS IV SOLN: 500.0000 mL | INTRAVENOUS | Status: DC | PRN

## 2020-04-15 MED ORDER — DIPHENHYDRAMINE HCL 50 MG/ML IJ SOLN: 12.5000 mg | INTRAMUSCULAR | Status: DC | PRN

## 2020-04-15 MED ORDER — LIDOCAINE HCL (PF) 1 % IJ SOLN: 30.0000 mL | INTRAMUSCULAR | Status: DC | PRN

## 2020-04-15 NOTE — Anesthesia Procedure Notes (Signed)
Epidural Patient location during procedure: OB Start time: 04/15/2020 3:05 PM End time: 04/15/2020 3:25 PM  Staffing Anesthesiologist: Mellody Dance, MD Performed: anesthesiologist   Preanesthetic Checklist Completed: patient identified, IV checked, site marked, risks and benefits discussed, monitors and equipment checked, pre-op evaluation and timeout performed  Epidural Patient position: sitting Prep: DuraPrep Patient monitoring: heart rate, cardiac monitor, continuous pulse ox and blood pressure Approach: midline Location: L2-L3 Injection technique: LOR saline  Needle:  Needle type: Tuohy  Needle gauge: 17 G Needle length: 9 cm Needle insertion depth: 5.5 cm Catheter type: closed end flexible Catheter size: 20 Guage Catheter at skin depth: 10.5 cm Test dose: negative and Other  Assessment Events: blood not aspirated, injection not painful, no injection resistance and negative IV test  Additional Notes Informed consent obtained prior to proceeding including risk of failure, 1% risk of PDPH, risk of minor discomfort and bruising.  Discussed rare but serious complications including epidural abscess, permanent nerve injury, epidural hematoma.  Discussed alternatives to epidural analgesia and patient desires to proceed.  Timeout performed pre-procedure verifying patient name, procedure, and platelet count.  Patient tolerated procedure well.

## 2020-04-15 NOTE — Anesthesia Preprocedure Evaluation (Addendum)
Anesthesia Evaluation  Patient identified by MRN, date of birth, ID band Patient awake    Reviewed: Allergy & Precautions, NPO status , Patient's Chart, lab work & pertinent test results  Airway Mallampati: II  TM Distance: >3 FB Neck ROM: Full    Dental no notable dental hx.    Pulmonary neg pulmonary ROS,    Pulmonary exam normal breath sounds clear to auscultation       Cardiovascular Exercise Tolerance: Good hypertension (on nifedipine), Pt. on medications Normal cardiovascular exam Rhythm:Regular Rate:Bradycardia  Baseline HR 30-40s   Neuro/Psych Anxiety negative neurological ROS     GI/Hepatic negative GI ROS, Neg liver ROS,   Endo/Other  Hypothyroidism   Renal/GU negative Renal ROS  negative genitourinary   Musculoskeletal negative musculoskeletal ROS (+)   Abdominal   Peds negative pediatric ROS (+)  Hematology negative hematology ROS (+)   Anesthesia Other Findings   Reproductive/Obstetrics (+) Pregnancy                            Anesthesia Physical Anesthesia Plan  ASA: II  Anesthesia Plan: Epidural   Post-op Pain Management:    Induction:   PONV Risk Score and Plan: 2 and Treatment may vary due to age or medical condition  Airway Management Planned: Natural Airway  Additional Equipment:   Intra-op Plan:   Post-operative Plan:   Informed Consent: I have reviewed the patients History and Physical, chart, labs and discussed the procedure including the risks, benefits and alternatives for the proposed anesthesia with the patient or authorized representative who has indicated his/her understanding and acceptance.       Plan Discussed with: Anesthesiologist  Anesthesia Plan Comments: (C- section called for non-reassuring fetal heart tracing. OB is on the way in and expected time to OR is in ~10min. Plan to use epidural for surgical anesthesia, backup plan GETA.  Tanna Furry, MD  )       Anesthesia Quick Evaluation

## 2020-04-15 NOTE — Progress Notes (Signed)
Labs from PN record  GBS pos (per pt) Normal panorama RI, Hep B neg Rh pos HIV neg RPR NR AFP neg  Lendon Colonel 04/15/2020 11:49 AM

## 2020-04-15 NOTE — Progress Notes (Signed)
IOL for chronic htn  G2P1, prior SVD with VAVD. uncompllicated preg pre pt. GBS.  PE: There were no vitals filed for this visit. COmfortable, no distress cvx FT/ long/ vtx Toco: none FH 120s, reactive  Cervical Foley: consent obtained, exam done, foley cath threaded through cvx manually, inflated with 60cc NS. Pt tolerated well.   A/P IOL, chronic htn. Pit, cervical foley GBS pos, pCN Reactive fetal testing  Lendon Colonel 04/15/2020 11:14 AM

## 2020-04-15 NOTE — H&P (Addendum)
Mikayla Schroeder is a 40 y.o. G3P1011 at [redacted]w[redacted]d gestation presents for  IOL d/t CHTN.   Antepartum course: ama, >41; chtn, stable, anxiety/depression,stable [facility] at [facility] since 10 wks.  See complete pre-natal records  History OB History    Gravida  3   Para  1   Term  1   Preterm      AB  1   Living  1     SAB  1   IAB      Ectopic      Multiple      Live Births  1          Past Medical History:  Diagnosis Date  . Anxiety   . Excessive sweating   . Family history of brain cancer   . Family history of breast cancer   . Family history of kidney cancer   . Family history of ovarian cancer   . Family history of uterine cancer   . Heart murmur   . Hormone disorder    Hypothyroid  . Hypertension    Past Surgical History:  Procedure Laterality Date  . REMOVAL OF HEMANGIOMA   1981  . TONSILLECTOMY  2006   TURBINATE REDUCTION  . WISDOM TOOTH EXTRACTION  2011   Family History: family history includes Brain cancer in an other family member; Breast cancer (age of onset: 51) in her mother; Cancer in her cousin; Diabetes in her maternal grandmother and mother; Heart failure in her maternal grandmother; Kidney cancer (age of onset: 37) in her father; Ovarian cancer in her paternal grandmother; Stroke in her maternal grandmother; Uterine cancer (age of onset: 78) in her mother. Social History:  reports that she has never smoked. She has never used smokeless tobacco. She reports previous alcohol use. She reports that she does not use drugs.  ROS: See above otherwise negative  Prenatal labs:  ABO, Rh: --/--/O POS (04/03 1020) Antibody: NEG (04/03 1020) Rubella:   RPR: Nonreactive (09/17 0000)  HBsAg: Negative (09/17 0000)  HIV:Non-reactive (09/17 0000)  GBS: Positive/-- (03/15 0000)  1 hr Glucola: Normal Genetic screening: Normal Anatomy US: Normal  Physical Exam:   Dilation: 4 Effacement (%): 60 Station: -2 Exam by:: Colon Flattery, CNM Blood  pressure 107/61, pulse (!) 49, temperature 98.3 F (36.8 C), temperature source Oral, resp. rate 16, height 5\' 3"  (1.6 m), weight 83.9 kg, last menstrual period 07/17/2019, SpO2 100 %. A&O x 3 HEENT: Normal Lungs: CTAB CV: RRR Abdominal: Soft, Non-tender, Gravid and Estimated fetal weight: 7-7 1/2 lbs lbs  Lower Extremities: Non-edematous, Non-tender  Pelvic Exam:      Dilatation: 4cm     Effacement: 70%     Station: -2     Presentation: Cephalic  Labs:  CBC:  Lab Results  Component Value Date   WBC 9.6 04/15/2020   RBC 3.36 (L) 04/15/2020   HGB 10.8 (L) 04/15/2020   HCT 30.5 (L) 04/15/2020   MCV 90.8 04/15/2020   MCH 32.1 04/15/2020   MCHC 35.4 04/15/2020   RDW 12.3 04/15/2020   PLT 154 04/15/2020   CMP:  Lab Results  Component Value Date   NA 136 04/15/2020   K 3.6 04/15/2020   CL 108 04/15/2020   CO2 21 (L) 04/15/2020   GLUCOSE 111 (H) 04/15/2020   BUN 8 04/15/2020   CREATININE 0.51 04/15/2020   CALCIUM 8.8 (L) 04/15/2020   PROT 5.9 (L) 04/15/2020   AST 21 04/15/2020   ALT 17 04/15/2020  ALBUMIN 2.8 (L) 04/15/2020   ALKPHOS 109 04/15/2020   BILITOT 0.6 04/15/2020   GFRNONAA >60 04/15/2020   GFRAA >90 01/13/2012   ANIONGAP 7 04/15/2020   Urine: No results found for: COLORURINE, APPEARANCEUR, LABSPEC, PHURINE, GLUCOSEU, HGBUR, BILIRUBINUR, KETONESUR, PROTEINUR, NITRITE, LEUKOCYTESUR   Prenatal Transfer Tool  Maternal Diabetes: No Genetic Screening: Normal Maternal Ultrasounds/Referrals: Normal Fetal Ultrasounds or other Referrals:  None Maternal Substance Abuse:  No Significant Maternal Medications:  Meds include: Zoloft Other: procardia Significant Maternal Lab Results: Group B Strep positive  FHT: 110, nml variabilty, +accels, no decels TOCO: irreg q 1-3 min  Assessment/Plan:  40 y.o. G3P1011 at [redacted]w[redacted]d gestation   1. IOL - s/p foley balloon, now on pitocin; not in active labor, continue pitocin, plan svd 2. CHTN - stable on procardia  during pregnancy, holding now d/t low normal bps, will follow closely now and pp; aga 6'4" (46%) at 36wga; labs wnl 3. ama >40 - nml nipt 4. gbs positive - continue penicillin prophylaxis 5. Anxiety-depression - zoloft, stable, contin pp and watch for pp depression 6. H/o vavd d/t fetal bradycardia   Vick Frees 04/15/2020, 5:40 PM

## 2020-04-15 NOTE — Progress Notes (Signed)
S: Doing well, pain well controlled with  Epidural, spouse at [facility] and supportive.  Denies HA/NV/RUQ pain/visual changes.    S/P cervical balloon and  Pitocin. AROM procedure requested by MD, R/B discussed and agrees to proceed.   O: Vitals:   04/15/20 1540 04/15/20 1545 04/15/20 1548 04/15/20 1601  BP: (!) 149/69  108/71 123/71  Pulse: (!) 46  (!) 46 (!) 48  Resp: 16 16  18   Temp:      TempSrc:      SpO2: 100% 100%    Weight:      Height:         FHT:  FHR: 120 bpm, variability: moderate,  accelerations:  Present,  decelerations:  Absent UC:   regular, every 3-4 minutes SVE:   Dilation: 4 Effacement (%): 60 Station: -2 Exam by:: [email or web address], CNM AROM clear fluid  A / P: Induction of labor due to [facility], AMA > 40,  progressing well on pitocin  Fetal Wellbeing:  Category I Pain Control:  Epidural  Anticipated MOD:  NSVD  AROM for augmentation per MD request. Alternate positions with peanut ball to facilitate descent and rotation.  GBS pos - continue PCN prophylaxis CHTN - on Procardia 30 XL prior to admit, normotensive mostly, will hold off for now given low pulse rate.   - no evidence of PEC, labs stable.  Anxiety stable on Zoloft 50, continue as established.   Dr. [facility] updated   Amado Nash, CNM, MSN 04/15/2020, 4:06 PM

## 2020-04-16 ENCOUNTER — Encounter (HOSPITAL_COMMUNITY)
Admission: AD | Disposition: A | Discharge status: Home / Self Care | Payer: Self-pay | Source: Home / Self Care | Attending: Obstetrics and Gynecology

## 2020-04-16 ENCOUNTER — Encounter (HOSPITAL_COMMUNITY): Payer: Self-pay | Admitting: Obstetrics and Gynecology

## 2020-04-16 DIAGNOSIS — I10 Essential (primary) hypertension: Secondary | ICD-10-CM | POA: Diagnosis present

## 2020-04-16 DIAGNOSIS — Z98891 History of uterine scar from previous surgery: Secondary | ICD-10-CM

## 2020-04-16 LAB — RPR: RPR Ser Ql: NONREACTIVE

## 2020-04-16 SURGERY — Surgical Case
Anesthesia: Epidural

## 2020-04-16 MED ORDER — SIMETHICONE 80 MG PO CHEW
80.0000 mg | CHEWABLE_TABLET | ORAL | Status: DC | PRN
  Filled 2020-04-16: qty 1

## 2020-04-16 MED ORDER — NALBUPHINE HCL 10 MG/ML IJ SOLN: 5.0000 mg | INTRAMUSCULAR | Status: DC | PRN

## 2020-04-16 MED ORDER — DIBUCAINE (PERIANAL) 1 % EX OINT: 1.0000 "application " | TOPICAL_OINTMENT | CUTANEOUS | Status: DC | PRN

## 2020-04-16 MED ORDER — OXYTOCIN-SODIUM CHLORIDE 30-0.9 UT/500ML-% IV SOLN
INTRAVENOUS | Status: AC
  Filled 2020-04-16: qty 500

## 2020-04-16 MED ORDER — NALBUPHINE HCL 10 MG/ML IJ SOLN
5.0000 mg | Freq: Once | INTRAMUSCULAR | Status: AC | PRN
Start: 2020-04-16 — End: 2020-04-17

## 2020-04-16 MED ORDER — STERILE WATER FOR IRRIGATION IR SOLN
Status: DC | PRN
Start: 2020-04-16 — End: 2020-04-16
  Administered 2020-04-16: 1000 mL

## 2020-04-16 MED ORDER — DEXAMETHASONE SODIUM PHOSPHATE 4 MG/ML IJ SOLN
INTRAMUSCULAR | Status: DC | PRN
  Administered 2020-04-16: 4 mg via INTRAVENOUS

## 2020-04-16 MED ORDER — ACETAMINOPHEN 500 MG PO TABS
1000.0000 mg | ORAL_TABLET | Freq: Four times a day (QID) | ORAL | Status: DC
  Administered 2020-04-16 – 2020-04-18 (×8): 1000 mg via ORAL
  Filled 2020-04-16 (×8): qty 2

## 2020-04-16 MED ORDER — OXYCODONE HCL 5 MG/5ML PO SOLN: 5.0000 mg | Freq: Once | ORAL | Status: DC | PRN

## 2020-04-16 MED ORDER — DIPHENHYDRAMINE HCL 25 MG PO CAPS: 25.0000 mg | ORAL_CAPSULE | Freq: Four times a day (QID) | ORAL | Status: DC | PRN

## 2020-04-16 MED ORDER — ONDANSETRON HCL 4 MG/2ML IJ SOLN
INTRAMUSCULAR | Status: AC
  Filled 2020-04-16: qty 2

## 2020-04-16 MED ORDER — ACETAMINOPHEN 10 MG/ML IV SOLN
1000.0000 mg | Freq: Once | INTRAVENOUS | Status: DC | PRN
  Administered 2020-04-16: 1000 mg via INTRAVENOUS

## 2020-04-16 MED ORDER — MEPERIDINE HCL 25 MG/ML IJ SOLN
6.2500 mg | INTRAMUSCULAR | Status: DC | PRN
Start: 2020-04-16 — End: 2020-04-16

## 2020-04-16 MED ORDER — KETOROLAC TROMETHAMINE 30 MG/ML IJ SOLN
INTRAMUSCULAR | Status: AC
  Filled 2020-04-16: qty 1

## 2020-04-16 MED ORDER — COCONUT OIL OIL
1.0000 "application " | TOPICAL_OIL | Status: DC | PRN
  Administered 2020-04-17: 1 via TOPICAL

## 2020-04-16 MED ORDER — WITCH HAZEL-GLYCERIN EX PADS: 1.0000 "application " | MEDICATED_PAD | CUTANEOUS | Status: DC | PRN

## 2020-04-16 MED ORDER — OXYCODONE HCL 5 MG PO TABS: 5.0000 mg | ORAL_TABLET | ORAL | Status: DC | PRN

## 2020-04-16 MED ORDER — OXYTOCIN-SODIUM CHLORIDE 30-0.9 UT/500ML-% IV SOLN: 2.5000 [IU]/h | INTRAVENOUS | Status: AC

## 2020-04-16 MED ORDER — ONDANSETRON HCL 4 MG/2ML IJ SOLN
INTRAMUSCULAR | Status: DC | PRN
  Administered 2020-04-16: 4 mg via INTRAVENOUS

## 2020-04-16 MED ORDER — CEFAZOLIN SODIUM-DEXTROSE 2-3 GM-%(50ML) IV SOLR
INTRAVENOUS | Status: DC | PRN
  Administered 2020-04-16: 2 g via INTRAVENOUS

## 2020-04-16 MED ORDER — SENNOSIDES-DOCUSATE SODIUM 8.6-50 MG PO TABS
2.0000 | ORAL_TABLET | Freq: Every day | ORAL | Status: DC
  Administered 2020-04-17 – 2020-04-18 (×2): 2 via ORAL
  Filled 2020-04-16 (×3): qty 2

## 2020-04-16 MED ORDER — LIDOCAINE-EPINEPHRINE (PF) 2 %-1:200000 IJ SOLN
INTRAMUSCULAR | Status: DC | PRN
  Administered 2020-04-16 (×2): 5 mL via EPIDURAL

## 2020-04-16 MED ORDER — IBUPROFEN 800 MG PO TABS
800.0000 mg | ORAL_TABLET | Freq: Four times a day (QID) | ORAL | Status: DC
  Administered 2020-04-17 – 2020-04-18 (×5): 800 mg via ORAL
  Filled 2020-04-16 (×5): qty 1

## 2020-04-16 MED ORDER — MORPHINE SULFATE (PF) 0.5 MG/ML IJ SOLN
INTRAMUSCULAR | Status: DC | PRN
  Administered 2020-04-16: 3 mg via EPIDURAL

## 2020-04-16 MED ORDER — DEXAMETHASONE SODIUM PHOSPHATE 4 MG/ML IJ SOLN
INTRAMUSCULAR | Status: AC
  Filled 2020-04-16: qty 1

## 2020-04-16 MED ORDER — SODIUM CHLORIDE 0.9% FLUSH: 3.0000 mL | INTRAVENOUS | Status: DC | PRN

## 2020-04-16 MED ORDER — TETANUS-DIPHTH-ACELL PERTUSSIS 5-2.5-18.5 LF-MCG/0.5 IM SUSY: 0.5000 mL | PREFILLED_SYRINGE | Freq: Once | INTRAMUSCULAR | Status: DC

## 2020-04-16 MED ORDER — MENTHOL 3 MG MT LOZG: 1.0000 | LOZENGE | OROMUCOSAL | Status: DC | PRN

## 2020-04-16 MED ORDER — DIPHENHYDRAMINE HCL 50 MG/ML IJ SOLN: 12.5000 mg | INTRAMUSCULAR | Status: DC | PRN

## 2020-04-16 MED ORDER — ACETAMINOPHEN 10 MG/ML IV SOLN
INTRAVENOUS | Status: AC
  Filled 2020-04-16: qty 100

## 2020-04-16 MED ORDER — NALOXONE HCL 0.4 MG/ML IJ SOLN: 0.4000 mg | INTRAMUSCULAR | Status: DC | PRN

## 2020-04-16 MED ORDER — OXYCODONE HCL 5 MG PO TABS: 5.0000 mg | ORAL_TABLET | Freq: Once | ORAL | Status: DC | PRN

## 2020-04-16 MED ORDER — CEFAZOLIN SODIUM-DEXTROSE 2-4 GM/100ML-% IV SOLN
INTRAVENOUS | Status: AC
  Filled 2020-04-16: qty 100

## 2020-04-16 MED ORDER — DIPHENHYDRAMINE HCL 25 MG PO CAPS
25.0000 mg | ORAL_CAPSULE | ORAL | Status: DC | PRN
  Administered 2020-04-17: 25 mg via ORAL
  Filled 2020-04-16: qty 1

## 2020-04-16 MED ORDER — LACTATED RINGERS IV SOLN: INTRAVENOUS | Status: DC

## 2020-04-16 MED ORDER — ALBUMIN HUMAN 5 % IV SOLN
12.5000 g | Freq: Once | INTRAVENOUS | Status: AC
  Administered 2020-04-16: 12.5 g via INTRAVENOUS

## 2020-04-16 MED ORDER — PROMETHAZINE HCL 25 MG/ML IJ SOLN: 6.2500 mg | INTRAMUSCULAR | Status: DC | PRN

## 2020-04-16 MED ORDER — ONDANSETRON HCL 4 MG/2ML IJ SOLN: 4.0000 mg | Freq: Three times a day (TID) | INTRAMUSCULAR | Status: DC | PRN

## 2020-04-16 MED ORDER — MORPHINE SULFATE (PF) 0.5 MG/ML IJ SOLN
INTRAMUSCULAR | Status: AC
  Filled 2020-04-16: qty 10

## 2020-04-16 MED ORDER — LACTATED RINGERS IV SOLN: INTRAVENOUS | Status: DC | PRN

## 2020-04-16 MED ORDER — PRENATAL MULTIVITAMIN CH
1.0000 | ORAL_TABLET | Freq: Every day | ORAL | Status: DC
  Administered 2020-04-17 – 2020-04-18 (×2): 1 via ORAL
  Filled 2020-04-16 (×3): qty 1

## 2020-04-16 MED ORDER — FENTANYL CITRATE (PF) 100 MCG/2ML IJ SOLN
INTRAMUSCULAR | Status: DC | PRN
  Administered 2020-04-16: 100 ug via EPIDURAL

## 2020-04-16 MED ORDER — KETOROLAC TROMETHAMINE 30 MG/ML IJ SOLN: 30.0000 mg | Freq: Four times a day (QID) | INTRAMUSCULAR | Status: DC | PRN

## 2020-04-16 MED ORDER — OXYTOCIN-SODIUM CHLORIDE 30-0.9 UT/500ML-% IV SOLN
INTRAVENOUS | Status: DC | PRN
  Administered 2020-04-16: 400 mL via INTRAVENOUS

## 2020-04-16 MED ORDER — FENTANYL CITRATE (PF) 100 MCG/2ML IJ SOLN
INTRAMUSCULAR | Status: AC
  Filled 2020-04-16: qty 2

## 2020-04-16 MED ORDER — KETOROLAC TROMETHAMINE 30 MG/ML IJ SOLN
30.0000 mg | Freq: Four times a day (QID) | INTRAMUSCULAR | Status: AC
  Administered 2020-04-16 – 2020-04-17 (×4): 30 mg via INTRAVENOUS
  Filled 2020-04-16 (×4): qty 1

## 2020-04-16 MED ORDER — NALBUPHINE HCL 10 MG/ML IJ SOLN
5.0000 mg | INTRAMUSCULAR | Status: DC | PRN
Start: 2020-04-16 — End: 2020-04-18

## 2020-04-16 MED ORDER — PHENYLEPHRINE HCL (PRESSORS) 10 MG/ML IV SOLN
INTRAVENOUS | Status: DC | PRN
  Administered 2020-04-16 (×2): 40 ug via INTRAVENOUS
  Administered 2020-04-16: 80 ug via INTRAVENOUS

## 2020-04-16 MED ORDER — SERTRALINE HCL 50 MG PO TABS
50.0000 mg | ORAL_TABLET | Freq: Every day | ORAL | Status: DC
  Administered 2020-04-16 – 2020-04-18 (×3): 50 mg via ORAL
  Filled 2020-04-16 (×2): qty 1

## 2020-04-16 MED ORDER — FENTANYL CITRATE (PF) 100 MCG/2ML IJ SOLN
25.0000 ug | INTRAMUSCULAR | Status: DC | PRN
  Administered 2020-04-16: 50 ug via INTRAVENOUS

## 2020-04-16 MED ORDER — ACETAMINOPHEN 500 MG PO TABS: 1000.0000 mg | ORAL_TABLET | Freq: Four times a day (QID) | ORAL | Status: DC

## 2020-04-16 MED ORDER — PHENYLEPHRINE 40 MCG/ML (10ML) SYRINGE FOR IV PUSH (FOR BLOOD PRESSURE SUPPORT)
PREFILLED_SYRINGE | INTRAVENOUS | Status: AC
  Filled 2020-04-16: qty 10

## 2020-04-16 MED ORDER — NALBUPHINE HCL 10 MG/ML IJ SOLN
5.0000 mg | Freq: Once | INTRAMUSCULAR | Status: AC | PRN
Start: 2020-04-16 — End: 2020-04-17
  Administered 2020-04-17: 5 mg via INTRAVENOUS
  Filled 2020-04-16: qty 1

## 2020-04-16 MED ORDER — SODIUM CHLORIDE 0.9 % IR SOLN
Status: DC | PRN
Start: 2020-04-16 — End: 2020-04-16
  Administered 2020-04-16: 1

## 2020-04-16 MED ORDER — SIMETHICONE 80 MG PO CHEW
80.0000 mg | CHEWABLE_TABLET | Freq: Three times a day (TID) | ORAL | Status: DC
  Administered 2020-04-16 – 2020-04-18 (×6): 80 mg via ORAL
  Filled 2020-04-16 (×7): qty 1

## 2020-04-16 MED ORDER — NALOXONE HCL 4 MG/10ML IJ SOLN
1.0000 ug/kg/h | INTRAVENOUS | Status: DC | PRN
  Filled 2020-04-16: qty 5

## 2020-04-16 MED ORDER — ALBUMIN HUMAN 5 % IV SOLN
INTRAVENOUS | Status: AC
  Filled 2020-04-16: qty 250

## 2020-04-16 MED ORDER — AMISULPRIDE (ANTIEMETIC) 5 MG/2ML IV SOLN: 10.0000 mg | Freq: Once | INTRAVENOUS | Status: DC | PRN

## 2020-04-16 SURGICAL SUPPLY — 35 items
APL SKNCLS STERI-STRIP NONHPOA (GAUZE/BANDAGES/DRESSINGS) ×1
BENZOIN TINCTURE PRP APPL 2/3 (GAUZE/BANDAGES/DRESSINGS) ×2 IMPLANT
CHLORAPREP W/TINT 26ML (MISCELLANEOUS) ×2 IMPLANT
CLAMP CORD UMBIL (MISCELLANEOUS) IMPLANT
CLOTH BEACON ORANGE TIMEOUT ST (SAFETY) ×2 IMPLANT
DRSG OPSITE POSTOP 4X10 (GAUZE/BANDAGES/DRESSINGS) ×2 IMPLANT
ELECT REM PT RETURN 9FT ADLT (ELECTROSURGICAL) ×2
ELECTRODE REM PT RTRN 9FT ADLT (ELECTROSURGICAL) ×1 IMPLANT
EXTRACTOR VACUUM KIWI (MISCELLANEOUS) ×2 IMPLANT
GLOVE BIOGEL PI IND STRL 6.5 (GLOVE) ×1 IMPLANT
GLOVE BIOGEL PI IND STRL 7.0 (GLOVE) ×2 IMPLANT
GLOVE BIOGEL PI INDICATOR 6.5 (GLOVE) ×1
GLOVE BIOGEL PI INDICATOR 7.0 (GLOVE) ×2
GLOVE ECLIPSE 6.0 STRL STRAW (GLOVE) ×2 IMPLANT
GOWN STRL REUS W/TWL LRG LVL3 (GOWN DISPOSABLE) ×4 IMPLANT
KIT ABG SYR 3ML LUER SLIP (SYRINGE) IMPLANT
NDL HYPO 25X5/8 SAFETYGLIDE (NEEDLE) IMPLANT
NEEDLE HYPO 25X5/8 SAFETYGLIDE (NEEDLE) IMPLANT
NS IRRIG 1000ML POUR BTL (IV SOLUTION) ×2 IMPLANT
PACK C SECTION WH (CUSTOM PROCEDURE TRAY) ×2 IMPLANT
PAD OB MATERNITY 4.3X12.25 (PERSONAL CARE ITEMS) ×2 IMPLANT
PENCIL SMOKE EVAC W/HOLSTER (ELECTROSURGICAL) ×2 IMPLANT
RETRACTOR WND ALEXIS 25 LRG (MISCELLANEOUS) IMPLANT
RTRCTR WOUND ALEXIS 25CM LRG (MISCELLANEOUS)
STRIP CLOSURE SKIN 1/2X4 (GAUZE/BANDAGES/DRESSINGS) ×1 IMPLANT
SUT MNCRL 0 VIOLET CTX 36 (SUTURE) ×2 IMPLANT
SUT MONOCRYL 0 CTX 36 (SUTURE) ×4
SUT PLAIN 2 0 XLH (SUTURE) IMPLANT
SUT VIC AB 0 CT1 36 (SUTURE) ×5 IMPLANT
SUT VIC AB 3-0 CT1 27 (SUTURE)
SUT VIC AB 3-0 CT1 TAPERPNT 27 (SUTURE) IMPLANT
SUT VIC AB 4-0 PS2 27 (SUTURE) ×2 IMPLANT
TOWEL OR 17X24 6PK STRL BLUE (TOWEL DISPOSABLE) ×2 IMPLANT
TRAY FOLEY W/BAG SLVR 14FR LF (SET/KITS/TRAYS/PACK) IMPLANT
WATER STERILE IRR 1000ML POUR (IV SOLUTION) ×2 IMPLANT

## 2020-04-16 NOTE — Progress Notes (Signed)
With  Epidural, feels vaginal pressure with right side positioning No other c/o  Patient Vitals for the past 24 hrs:  BP Temp Temp src Pulse Resp SpO2 Height Weight  04/16/20 0003 98/79 98.5 F (36.9 C) Oral 82 -- -- -- --  04/15/20 2130 (!) 99/57 -- -- (!) 54 -- -- -- --  04/15/20 2000 108/61 -- -- (!) 48 -- -- -- --  04/15/20 1930 -- 97.9 F (36.6 C) Oral -- 16 -- -- --  04/15/20 1900 119/76 -- -- (!) 50 16 -- -- --  04/15/20 1832 123/83 -- -- (!) 52 18 -- -- --  04/15/20 1800 102/60 97.9 F (36.6 C) Oral (!) 44 16 -- -- --  04/15/20 1731 107/61 -- -- (!) 49 16 -- -- --  04/15/20 1701 (!) 98/55 -- -- (!) 45 16 -- -- --  04/15/20 1630 114/69 -- -- (!) 46 18 100 % -- --  04/15/20 1601 123/71 -- -- (!) 48 18 -- -- --  04/15/20 1552 105/63 -- -- (!) 45 18 -- -- --  04/15/20 1548 108/71 -- -- (!) 46 -- -- -- --  04/15/20 1545 -- -- -- -- 16 100 % -- --  04/15/20 1540 (!) 149/69 -- -- (!) 46 16 100 % -- --  04/15/20 1536 110/65 -- -- (!) 49 16 -- -- --  04/15/20 1531 125/63 -- -- (!) 44 16 -- -- --  04/15/20 1525 123/65 -- -- (!) 52 16 100 % -- --  04/15/20 1521 116/80 -- -- 61 15 -- -- --  04/15/20 1520 -- -- -- -- -- 100 % -- --  04/15/20 1519 140/83 -- -- (!) 58 18 -- -- --  04/15/20 1431 117/79 -- -- (!) 50 18 -- -- --  04/15/20 1356 122/70 -- -- (!) 49 16 -- -- --  04/15/20 1331 118/73 -- -- (!) 56 18 -- -- --  04/15/20 1301 113/76 -- -- (!) 53 18 -- -- --  04/15/20 1231 114/78 -- -- (!) 53 18 -- -- --  04/15/20 1201 112/73 98.3 F (36.8 C) Oral (!) 52 18 -- -- --  04/15/20 1131 117/70 -- -- (!) 54 16 -- 5\' 3"  (1.6 m) 83.9 kg  04/15/20 1112 116/73 -- -- 65 18 -- -- --  04/15/20 1009 102/72 -- -- (!) 59 18 -- -- --   A&ox3 nml    Respirations Abd:  Soft, nt, gravid Cx:   4-5, 70/-1; iupc placed LE : no edema, nt bilat  FHT:120s, nml variabililty, +accels, pt had a decel about 1   Min to       90s then return totbaseline but then ha 6 1/2 min decel to 90s/100; then  pitocin off and given terbuatine; another short decel < 1 min and then baseline of 140s; nml variability and accels noted; 2 30 sec decels then resolved with position change and no further decels Toco: irregular and spaced d/t terbutaline, now about q 2 min  A/p: IUP AT 39.1 1. iol - not in active labor, since pitocin stopped, will allow FHT to regroup but then also plan a pitocin break for an hour to see if uterus will better respond to pitocin and then begin increasing again; guarded mode of delivery 2. Fetal status - reassuring now, will follow closely; if another prolonged decel remote from delivery discussed possible c/s 3. gbs positive - contin pcn prophylaxis 4. Rh pos

## 2020-04-16 NOTE — Transfer of Care (Signed)
Immediate Anesthesia Transfer of Care Note  Patient: Mikayla Schroeder  Procedure(s) Performed: CESAREAN SECTION (N/A )  Patient Location: PACU  Anesthesia Type:Epidural  Level of Consciousness: awake, alert  and patient cooperative  Airway & Oxygen Therapy: Patient Spontanous Breathing  Post-op Assessment: Report given to RN and Post -op Vital signs reviewed and stable  Post vital signs: Reviewed and stable  Last Vitals:  Vitals Value Taken Time  BP 117/61 04/16/20 0653  Temp    Pulse 64 04/16/20 0657  Resp 21 04/16/20 0657  SpO2 97 % 04/16/20 0657  Vitals shown include unvalidated device data.  Last Pain:  Vitals:   04/16/20 0410  TempSrc: Oral  PainSc:          Complications: No complications documented.

## 2020-04-16 NOTE — Progress Notes (Signed)
Tired, comfortable  Patient Vitals for the past 24 hrs:  BP Temp Temp src Pulse Resp SpO2 Height Weight  04/16/20 0500 (!) 97/53 -- -- 61 -- -- -- --  04/16/20 0430 125/79 -- -- (!) 57 -- -- -- --  04/16/20 0410 -- 100.1 F (37.8 C) Oral -- -- -- -- --  04/16/20 0400 (!) 112/55 -- -- (!) 59 -- -- -- --  04/16/20 0330 105/60 -- -- 63 -- -- -- --  04/16/20 0301 (!) 105/52 -- -- 68 -- -- -- --  04/16/20 0100 (!) 95/51 -- -- 78 -- -- -- --  04/16/20 0003 98/79 98.5 F (36.9 C) Oral 82 -- -- -- --  04/15/20 2130 (!) 99/57 -- -- (!) 54 -- -- -- --  04/15/20 2000 108/61 -- -- (!) 48 -- -- -- --  04/15/20 1930 -- 97.9 F (36.6 C) Oral -- 16 -- -- --  04/15/20 1900 119/76 -- -- (!) 50 16 -- -- --  04/15/20 1832 123/83 -- -- (!) 52 18 -- -- --  04/15/20 1800 102/60 97.9 F (36.6 C) Oral (!) 44 16 -- -- --  04/15/20 1731 107/61 -- -- (!) 49 16 -- -- --  04/15/20 1701 (!) 98/55 -- -- (!) 45 16 -- -- --  04/15/20 1630 114/69 -- -- (!) 46 18 100 % -- --  04/15/20 1601 123/71 -- -- (!) 48 18 -- -- --  04/15/20 1552 105/63 -- -- (!) 45 18 -- -- --  04/15/20 1548 108/71 -- -- (!) 46 -- -- -- --  04/15/20 1545 -- -- -- -- 16 100 % -- --  04/15/20 1540 (!) 149/69 -- -- (!) 46 16 100 % -- --  04/15/20 1536 110/65 -- -- (!) 49 16 -- -- --  04/15/20 1531 125/63 -- -- (!) 44 16 -- -- --  04/15/20 1525 123/65 -- -- (!) 52 16 100 % -- --  04/15/20 1521 116/80 -- -- 61 15 -- -- --  04/15/20 1520 -- -- -- -- -- 100 % -- --  04/15/20 1519 140/83 -- -- (!) 58 18 -- -- --  04/15/20 1431 117/79 -- -- (!) 50 18 -- -- --  04/15/20 1356 122/70 -- -- (!) 49 16 -- -- --  04/15/20 1331 118/73 -- -- (!) 56 18 -- -- --  04/15/20 1301 113/76 -- -- (!) 53 18 -- -- --  04/15/20 1231 114/78 -- -- (!) 53 18 -- -- --  04/15/20 1201 112/73 98.3 F (36.8 C) Oral (!) 52 18 -- -- --  04/15/20 1131 117/70 -- -- (!) 54 16 -- 5\' 3"  (1.6 m) 83.9 kg  04/15/20 1112 116/73 -- -- 65 18 -- -- --  04/15/20 1009 102/72 -- -- (!)  59 18 -- -- --    A&ox3 nml respirations Abd: soft, nt,gravid Cx: 5/70/-1 LE: no edema, nt bilat  FHT: 150s, nml variability, recurrent late decels 30s to 1 1/2 min;  TOCO: q2-3, pitocin stopped  A/P: iupat 39.1 1. NRFHT/fetal intolerance to labor - remote from delivery, unable to proceed for svd at this time, recommend c/s - procedure reviewed with patient and her husband; risk of bleeding/blood transfusion, infection, injury to bowel/bladder/nerves/blood vessels, risk of further surgery, risk of anesthesia; consent signed and patient agrees to proceed; to OR

## 2020-04-16 NOTE — Op Note (Signed)
Cesarean Section Procedure Note   Mikayla Schroeder  04/15/2020 - 04/16/2020  Indications: NRFHT/fetal intolerance to labor remote from delivery   Pre-operative Diagnosis: primary cesarean section for nonreassuring fetal heart rate tracing; CHTN, ama, anxiety/depression, iup at 39.1wga   Post-operative Diagnosis: Same  Procedure: 1ltcs Surgeon: Surgeon(s) and Role:    * Vick Frees, MD - Primary   Assistants: none  Anesthesia: epidural   Procedure Details:  The patient was seen in the labor Room. The risks, benefits, complications, treatment options, and expected outcomes were discussed with the patient. The patient concurred with the proposed plan, giving informed consent. identified as Mikayla Schroeder and the procedure verified as C-Section Delivery. A Time Out was held and the above information confirmed.  After induction of anesthesia, the patient was draped and prepped in the usual sterile manner, foley was draining urine well.  A pfannenstiel incision was made and carried down through the subcutaneous tissue to the fascia. Fascial incision was made and extended transversely. The fascia was separated from the underlying rectus tissue superiorly and inferiorly. The peritoneum was identified and entered. Peritoneal incision was extended longitudinally. Alexis-O retractor placed. The utero-vesical peritoneal reflection was incised transversely and the bladder flap was bluntly freed from the lower uterine segment. A low transverse uterine incision was made. Delivered from cephalic presentation was a viable female infant with vigorous cry. Apgar scores of 8 at one minute and 9 at five minutes. Delayed cord clamping done at 1 minute and baby handed to NICU team in attendance. Cord ph was sent but unable to be read. Cord blood was obtained for evaluation. The placenta was removed Intact, spontaneously and appeared normal, ovaries not seen d/t bowel. The uterine outline, tubes bilaterally appeared  normal}. The uterine incision was closed with running locked sutures of 0 monocryl. A second imbricating layer sutured was done.   Hemostasis was observed. Alexis retractor removed. Peritoneal closure done with 2-0 Vicryl.  The fascia was then reapproximated with running sutures of 0Vicryl. The subcuticular closure was performed using 2-0plain gut. The skin was closed with 4-0Vicryl.   Instrument, sponge, and needle counts were correct prior the abdominal closure and were correct at the conclusion of the case.    Findings: viable female infant, apgars 8/9   Estimated Blood Loss:   Total IV Fluids:   Urine Output: 25CC OF bloody urine (urine this color prior to surgery)  Specimens: none  Complications: no complications  Disposition: PACU - hemodynamically stable.   Maternal Condition: stable   Baby condition / location:  Couplet care / Skin to Skin  Attending Attestation: I performed the procedure.   Signed: Surgeon(s): Amado Nash Candace Gallus, MD

## 2020-04-16 NOTE — Lactation Note (Signed)
This note was copied from a baby's chart. Lactation Consultation Note  Patient Name: Mikayla Schroeder [id number] Date: 04/16/2020 Reason for consult: Follow-up assessment Age:42 hours   P2, Per mother baby is latching well.  Declined needing assistance at this time to latch. Feed on demand with cues.  Goal 8-12+ times per day after first 24 hrs.  Place baby STS if not cueing.  Mom made aware of O/P services, breastfeeding support groups and our phone # for post-discharge questions.   Maternal Data Has patient been taught Hand Expression?: Yes Does the patient have breastfeeding experience prior to this delivery?: Yes How long did the patient breastfeed?: 5 mos./ 8years ago   Interventions Interventions: Breast feeding basics reviewed;Education     Consult Status Consult Status: Follow-up Date: 04/17/20 Follow-up type: In-patient    [name] [facility]  RN [facility] 04/16/2020, 12:23 PM

## 2020-04-16 NOTE — Social Work (Signed)
MOB was referred for history of depression and anxiety.   * Referral screened out by Clinical Social Worker because none of the following criteria appear to apply:  ~ History of anxiety/depression during this pregnancy, or of post-partum depression following prior delivery. ~ Diagnosis of anxiety and/or depression within last 3 years. OR * MOB's symptoms currently being treated with medication and/or therapy. MOB currently prescribed Zoloft 50mg .  Please contact the Clinical Social Worker if needs arise, by [facility] request, or if MOB scores greater than 9/yes to question 10 on Edinburgh Postpartum Depression Screen.  03-21-1979, LCSWA Clinical Social Work [facility] [employer]  [phone]

## 2020-04-17 ENCOUNTER — Encounter (HOSPITAL_COMMUNITY): Payer: Self-pay | Admitting: Obstetrics and Gynecology

## 2020-04-17 DIAGNOSIS — O9902 Anemia complicating childbirth: Secondary | ICD-10-CM | POA: Diagnosis present

## 2020-04-17 LAB — CBC
HCT: 20.5 % — ABNORMAL LOW (ref 36.0–46.0)
Hemoglobin: 7.3 g/dL — ABNORMAL LOW (ref 12.0–15.0)
MCH: 33.5 pg (ref 26.0–34.0)
MCHC: 35.6 g/dL (ref 30.0–36.0)
MCV: 94 fL (ref 80.0–100.0)
Platelets: 143 10*3/uL — ABNORMAL LOW (ref 150–400)
RBC: 2.18 MIL/uL — ABNORMAL LOW (ref 3.87–5.11)
RDW: 12.7 % (ref 11.5–15.5)
WBC: 19.7 10*3/uL — ABNORMAL HIGH (ref 4.0–10.5)
nRBC: 0 % (ref 0.0–0.2)

## 2020-04-17 MED ORDER — POLYSACCHARIDE IRON COMPLEX 150 MG PO CAPS
150.0000 mg | ORAL_CAPSULE | Freq: Every day | ORAL | Status: DC
  Administered 2020-04-18: 150 mg via ORAL
  Filled 2020-04-17: qty 1

## 2020-04-17 MED ORDER — MAGNESIUM OXIDE 400 (241.3 MG) MG PO TABS
400.0000 mg | ORAL_TABLET | Freq: Every day | ORAL | Status: DC
  Administered 2020-04-17 – 2020-04-18 (×2): 400 mg via ORAL
  Filled 2020-04-17 (×2): qty 1

## 2020-04-17 MED ORDER — SODIUM CHLORIDE 0.9 % IV SOLN
500.0000 mg | INTRAVENOUS | Status: DC
  Administered 2020-04-17: 500 mg via INTRAVENOUS
  Filled 2020-04-17: qty 25

## 2020-04-17 NOTE — Progress Notes (Incomplete)
Subjective: POD# {NUMBERS 1-5:20334} Live born female  Birth Weight: 7 lb 6.2 oz (3350 g) APGAR: 8, 9  Newborn Delivery   Birth date/time: 04/[birth year]/2020 05:45:00 Delivery type: C-Section, Low Transverse Trial of labor: Yes C-section categorization: Primary     Baby name: *** Delivering provider: Rhoderick Moody E   circumcision *** Feeding: {feeding:120017}  Pain control at delivery: Epidural   Reports feeling ***  Patient reports tolerating PO.   Breast symptoms:*** Pain controlled with {treatments; pain control med:13496} Denies HA/SOB/C/P/N/V/dizziness. Flatus ***. She reports vaginal bleeding as normal, without clots.  She is ambulating, urinating without difficulty.     Objective:   VS:    Vitals:   04/16/20 1543 04/16/20 2000 04/17/20 0007 04/17/20 0630  BP:  (!) 105/51 (!) 111/58 102/69  Pulse:  (!) 58 63 (!) 52  Resp: 16 18 17 18   Temp: 98.4 F (36.9 C) 98.2 F (36.8 C) 98 F (36.7 C) 97.9 F (36.6 C)  TempSrc: Oral Oral Oral Oral  SpO2: 100% 100% 99% 95%  Weight:      Height:          Intake/Output Summary (Last 24 hours) at 04/17/2020 0811 Last data filed at 04/17/2020 0700 Gross per 24 hour  Intake -  Output 2025 ml  Net -2025 ml        Recent Labs    04/15/20 1021 04/17/20 0436  WBC 9.6 19.7*  HGB 10.8* 7.3*  HCT 30.5* 20.5*  PLT 154 143*     Blood type: --/--/O POS (04/03 1020)  Rubella:    Vaccines: TDaP          ***         Flu             ***                    COVID-19 ***   Physical Exam:  General: {Exam; general:16600} CV: {Exam; heart brief:31539} Resp: {Exam; lungs brief:12271} Abdomen: {Exam; abdomen brief:12273} Incision: {incision:3041137} Uterine Fundus: firm, below umbilicus, {Desc; tender/non:10087} Lochia: {exam; vaginal bleeding:3041122} Ext: {Exam; extremity:5109}      Assessment/Plan: 40 y.o.   POD# {NUMBERS 1-5:20334}. 46                  Principal Problem:   Postpartum care following cesarean  delivery 4/4 Active Problems:   Anxiety   Encounter for induction of labor   Status post primary low transverse cesarean section NRFHTs   Chronic hypertension   Doing well, stable.               Advance diet as tolerated Encourage rest when baby rests Breastfeeding support Encourage to ambulate Routine post-op care  6/4, CNM, MSN 04/17/2020, 8:11 AM

## 2020-04-17 NOTE — Progress Notes (Signed)
Subjective: POD# 1 Live born female  Birth Weight: 7 lb 6.2 oz (3350 g) APGAR: 8, 9  Newborn Delivery   Birth date/time: 04/16/2020 05:45:00 Delivery type: C-Section, Low Transverse Trial of labor: Yes C-section categorization: Primary     Baby name: Piper Delivering provider: Rhoderick Moody E   Feeding: breast  Pain control at delivery: Epidural   Reports feeling well.  Patient reports tolerating PO.   Breast symptoms:+ colostrum, good latch Pain controlled with PO meds Denies HA/SOB/C/P/N/V/dizziness. Flatus present. She reports vaginal bleeding as normal, without clots.  She is ambulating, urinating without difficulty.     Objective:   VS:    Vitals:   04/16/20 1543 04/16/20 2000 04/17/20 0007 04/17/20 0630  BP:  (!) 105/51 (!) 111/58 102/69  Pulse:  (!) 58 63 (!) 52  Resp: 16 18 17 18   Temp: 98.4 F (36.9 C) 98.2 F (36.8 C) 98 F (36.7 C) 97.9 F (36.6 C)  TempSrc: Oral Oral Oral Oral  SpO2: 100% 100% 99% 95%  Weight:      Height:          Intake/Output Summary (Last 24 hours) at 04/17/2020 0911 Last data filed at 04/17/2020 0700 Gross per 24 hour  Intake --  Output 2025 ml  Net -2025 ml        Recent Labs    04/15/20 1021 04/17/20 0436  WBC 9.6 19.7*  HGB 10.8* 7.3*  HCT 30.5* 20.5*  PLT 154 143*     Blood type: --/--/O POS (04/03 1020)  Rubella:   immune Vaccines: TDaP          UTD         Flu             UTD                    COVID-19 ?   Physical Exam:  General: alert, cooperative and no distress Abdomen: soft, nontender, normal bowel sounds Incision: clean, dry and intact Uterine Fundus: firm, below umbilicus, nontender Lochia: none Ext: no edema, redness or tenderness in the calves or thighs  Assessment/Plan: 40 y.o.   POD# 1. 46                  Principal Problem:   Postpartum care following cesarean delivery 4/4 Active Problems:   Anxiety   Encounter for induction of labor   Status post primary low transverse  cesarean section NRFHTs   Chronic hypertension   Maternal anemia, with delivery - IDA w/ ABL  - asymptomatic  - IV venofer today, start mag ox for gut motility  - start oral iron tomorrow  Doing well, stable.               Advance diet as tolerated Encourage rest when baby rests Breastfeeding support Encourage to ambulate Routine post-op care  6/4, CNM, MSN 04/17/2020, 9:11 AM

## 2020-04-17 NOTE — Lactation Note (Signed)
This note was copied from a baby's chart. Lactation Consultation Note  Patient Name: Mikayla Schroeder Date: 04/17/2020 Reason for consult: Follow-up assessment;Mother's request;Term;Infant weight loss;Other (Comment) (Iron transfusion) Age:42 hours  Reviewed with Mom feedings every 1-2 hrs with 1 urine and 2 stool today. Mom states yes and at times she thinks infant nursing for comfort. On arrival Mom had infant in cradle with a shallow latch, while in same arm getting an Iron transfusion.   LC assisted Mom placing infant in football with a deeper latch and using compression to keep her active at the breasts increase in depth of her swallows.   Mom has compression stripe and bruises on outer quadrants on top of both nipples. LC encouraged Mom to use EBM for wound healing and RN to provide coconut oil for wound care. Mom aware use oil when pumping and nursing. Mom has Medela pump at home preferred to use manual pump to increase her stimulation and let down.  LC reviewed paced bottle feeding with Dad and provided slow flow nipple and reviewed breastfeeding supplementation guide.   Plan 1 To feed based on cues 8-12x in 24 hr period no more than 4 hrs without an an attempt. Mom to offer both breasts, STS and look for swallows with breast compression.          2. Dad to paced bottle feed EBM / DBM if they decide to use it. LC reviewed DBM consent and parents will take it over. Mom try pumping first and see what her volume is. Mom to call RN to alert her if they decide to use DBM.         3. Mom to use manual pump q 3 hrs 10 minutes each breast.          4. LC reviewed how to chart for EBM/DBM in ml. How to check for a urine with change in color stripe.   All questions answered at the end of the visit.   Maternal Data Has patient been taught Hand Expression?: Yes Does the patient have breastfeeding experience prior to this delivery?: Yes How long did the patient breastfeed?: 7  months  Feeding Mother's Current Feeding Choice: Breast Milk  LATCH Score Latch: Repeated attempts needed to sustain latch, nipple held in mouth throughout feeding, stimulation needed to elicit sucking reflex.  Audible Swallowing: Spontaneous and intermittent  Type of Nipple: Everted at rest and after stimulation  Comfort (Breast/Nipple): Filling, red/small blisters or bruises, mild/mod discomfort  Hold (Positioning): Assistance needed to correctly position infant at breast and maintain latch.  LATCH Score: 7   Lactation Tools Discussed/Used Tools: Pump;Flanges;Coconut oil Flange Size: 27 Breast pump type: Manual Pump Education: Setup, frequency, and cleaning;Milk Storage Reason for Pumping: increase stimulation Pumping frequency: every 3 hrs 10 minutes each breast  Interventions Interventions: Breast feeding basics reviewed;Breast compression;Assisted with latch;Adjust position;Skin to skin;Support pillows;Breast massage;Position options;Hand express;Expressed milk;Education;Coconut oil  Discharge Pump: Personal WIC Program: No  Consult Status Consult Status: Follow-up Date: 04/18/20 Follow-up type: In-patient    Mikayla Hays  Schroeder 04/17/2020, 4:16 PM

## 2020-04-18 LAB — SURGICAL PATHOLOGY

## 2020-04-18 MED ORDER — MAGNESIUM OXIDE -MG SUPPLEMENT 400 (240 MG) MG PO TABS
400.0000 mg | ORAL_TABLET | Freq: Every day | ORAL | Status: DC
Start: 2020-04-18 — End: 2021-04-16

## 2020-04-18 MED ORDER — SIMETHICONE 80 MG PO CHEW: 80.0000 mg | CHEWABLE_TABLET | ORAL | 0 refills | Status: DC | PRN

## 2020-04-18 MED ORDER — ACETAMINOPHEN 500 MG PO TABS: 1000.0000 mg | ORAL_TABLET | Freq: Four times a day (QID) | ORAL | 0 refills | Status: DC

## 2020-04-18 MED ORDER — OXYCODONE HCL 5 MG PO TABS: 5.0000 mg | ORAL_TABLET | ORAL | 0 refills | Status: AC | PRN

## 2020-04-18 MED ORDER — SENNOSIDES-DOCUSATE SODIUM 8.6-50 MG PO TABS: 2.0000 | ORAL_TABLET | Freq: Every day | ORAL | Status: DC

## 2020-04-18 MED ORDER — COCONUT OIL OIL: 1.0000 "application " | TOPICAL_OIL | 0 refills | Status: DC | PRN

## 2020-04-18 MED ORDER — IBUPROFEN 800 MG PO TABS: 800.0000 mg | ORAL_TABLET | Freq: Four times a day (QID) | ORAL | 0 refills | Status: DC

## 2020-04-18 MED ORDER — POLYSACCHARIDE IRON COMPLEX 150 MG PO CAPS
150.0000 mg | ORAL_CAPSULE | Freq: Every day | ORAL | Status: DC
Start: 2020-04-18 — End: 2021-04-16

## 2020-04-18 NOTE — Lactation Note (Signed)
This note was copied from a baby's chart. Lactation Consultation Note  Patient Name: Mikayla Schroeder Date: 04/18/2020 Reason for consult: Follow-up assessment;Infant weight loss   Age:42 hours , 8 % weight loss , sore nipples bilaterally (  Intact positional strips )  Per mom the baby was sleepy during the night and only latched for 5 mins attempts . LC reviewed the doc flow sheets with mom and dad, baby cluster feed last evening and mom reports swallows.  LC assessed breast tissue prior to the baby latching and noted areola edema.  LC instructed mom to do the reverse pressure exercise prior to latch to insure depth.  MBURN completed her am assessment and assisted baby to latch in the cross cradle position. Swallows increased with breast compressions, and baby needed  Intermittent stimulation to feed for 12 mins. Nipple more well rounded when baby released. Latch 8  LC recommended prior to latching - breast massage, hand express, pre - pump with the hand pump to prime the milk ducts, reverse pressure, latch with depth.   #24 F checked good fit ).  LC discussed the need for extra pumping due to 8 % weight loss, mom declined the DEBP . LC then recommended prior to every latch pre pump both breast to prime the milk ducts to enhance the let down.  LC also provided comfort gels for after feedings, alternating with  breast shells while awake.  Marda Stalker , [facility] aware of the above consult results.  Maternal Data Has patient been taught Hand Expression?: Yes Does the patient have breastfeeding experience prior to this delivery?: Yes  Feeding Mother's Current Feeding Choice: Breast Milk  LATCH Score Latch: Grasps breast easily, tongue down, lips flanged, rhythmical sucking.  Audible Swallowing: A few with stimulation  Type of Nipple: Everted at rest and after stimulation  Comfort (Breast/Nipple): Soft / non-tender  Hold (Positioning): Assistance needed to correctly position  infant at breast and maintain latch.  LATCH Score: 8   Lactation Tools Discussed/Used    Interventions Interventions: Breast feeding basics reviewed;Assisted with latch;Skin to skin;Hand express;Reverse pressure;Breast compression;Adjust position;Support pillows;Position options;Shells;Hand pump;Education  Discharge    Consult Status Consult Status: Follow-up Date: 04/19/20 Follow-up type: In-patient    Mikayla Schroeder 04/18/2020, 9:05 AM

## 2020-04-18 NOTE — Progress Notes (Addendum)
Subjective: POD# 2 Live born female  Birth Weight: 7 lb 6.2 oz (3350 g) APGAR: 8, 9  Newborn Delivery   Birth date/time: 04/16/2020 05:45:00 Delivery type: C-Section, Low Transverse Trial of labor: Yes C-section categorization: Primary     Baby name: Piper Delivering provider: Rhoderick Moody E   Feeding: breast  Pain control at delivery: Epidural   Reports feeling tired but well.  Patient reports tolerating PO.   Breast symptoms: sore nipples, shallow latch, working w/ LC Pain controlled with PO meds Denies HA/SOB/C/P/N/V/dizziness. Flatus present, no BM. She reports vaginal bleeding as normal, without clots.  She is ambulating, urinating without difficulty.     Objective:   VS:    Vitals:   04/17/20 0630 04/17/20 1453 04/17/20 2205 04/18/20 0600  BP: 102/69 (!) 100/53 (!) 109/59 117/70  Pulse: (!) 52 (!) 53 61 (!) 54  Resp: 18 20 16 18   Temp: 97.9 F (36.6 C) 98 F (36.7 C) 98.6 F (37 C) 98.2 F (36.8 C)  TempSrc: Oral Oral Oral Oral  SpO2: 95% 100% 99% 100%  Weight:      Height:          Intake/Output Summary (Last 24 hours) at 04/18/2020 0852 Last data filed at 04/18/2020 0500 Gross per 24 hour  Intake 0 ml  Output 425 ml  Net -425 ml        Recent Labs    04/15/20 1021 04/17/20 0436  WBC 9.6 19.7*  HGB 10.8* 7.3*  HCT 30.5* 20.5*  PLT 154 143*     Blood type: --/--/O POS (04/03 1020)  Rubella:   immune    Physical Exam:  General: alert, cooperative and no distress Abdomen: soft, nontender, normal bowel sounds Incision: clean, dry and intact Uterine Fundus: firm, below umbilicus, nontender Lochia: minimal Ext: no edema, redness or tenderness in the calves or thighs   Assessment/Plan: 40 y.o.   POD# 2. 46                  Principal Problem:   Postpartum care following cesarean delivery 4/4 Active Problems:   Anxiety  - stable on Zoloft 50 mg daily   Encounter for induction of labor   Status post primary low transverse  cesarean section NRFHTs   Chronic hypertension  - normotensive off Procardia, no s/sx of PEC  - continue to monitor closely   Maternal anemia, with delivery - IDA w/ ABL  - asymptomatic  - s/p IV venofer  - continue with oral iron and Mag Ox  Doing well, stable.    LC support for latch Routine post-op care Anticipate DC in AM  6/4, CNM, MSN 04/18/2020, 8:52 AM

## 2020-04-18 NOTE — Discharge Summary (Signed)
OB Discharge Summary  Patient Name: Mikayla Schroeder DOB: Oct 28, 1980 MRN: [id number]  Date of admission: 04/15/2020 Delivering provider: Rhoderick Moody E   Admitting diagnosis: Encounter for induction of labor [Z34.90] Intrauterine pregnancy: [redacted]w[redacted]d     Secondary diagnosis: Patient Active Problem List   Diagnosis Date Noted  . Maternal anemia, with delivery - IDA w/ ABL 04/17/2020  . Status post primary low transverse cesarean section NRFHTs 04/16/2020  . Postpartum care following cesarean delivery 4/4 04/16/2020  . Chronic hypertension 04/16/2020  . Encounter for induction of labor 04/15/2020  . Anxiety    Additional problems:none   Date of discharge: 04/18/2020   Discharge diagnosis: Principal Problem:   Postpartum care following cesarean delivery 4/4 Active Problems:   Anxiety   Encounter for induction of labor   Status post primary low transverse cesarean section NRFHTs   Chronic hypertension   Maternal anemia, with delivery - IDA w/ ABL                                                              Post partum procedures:IV Venofer transfusion  Augmentation: AROM, Pitocin and IP Foley Pain control: Epidural  Laceration:None  Episiotomy:None  Complications: None  Hospital course:  Induction of Labor With Cesarean Section   40 y.o. yo [id number] at [redacted]w[redacted]d was admitted to the hospital 04/15/2020 for induction of labor. Patient had a labor course significant for fetal intolerance to labor and protracted labor. The patient went for cesarean section due to Non-Reassuring FHR. Delivery details are as follows: Membrane Rupture Time/Date: 4:00 PM ,04/15/2020   Delivery Method:C-Section, Low Transverse  Details of operation can be found in separate operative Note.  Patient had an uncomplicated postpartum course. She is ambulating, tolerating a regular diet, passing flatus, and urinating well.  Patient is discharged home in stable condition on 04/18/20.      Newborn Data: Birth  date:04/16/2020  Birth time:5:45 AM  Gender:Female  Living status:Living  Apgars:8 ,9  Weight:3350 g                                 Physical exam  Vitals:   04/17/20 0630 04/17/20 1453 04/17/20 2205 04/18/20 0600  BP: 102/69 (!) 100/53 (!) 109/59 117/70  Pulse: (!) 52 (!) 53 61 (!) 54  Resp: 18 20 16 18   Temp: 97.9 F (36.6 C) 98 F (36.7 C) 98.6 F (37 C) 98.2 F (36.8 C)  TempSrc: Oral Oral Oral Oral  SpO2: 95% 100% 99% 100%  Weight:      Height:       General: alert, cooperative and no distress Lochia: appropriate Uterine Fundus: firm Incision: Dressing is clean, dry, and intact DVT Evaluation: No significant calf/ankle edema. Labs: Lab Results  Component Value Date   WBC 19.7 (H) 04/17/2020   HGB 7.3 (L) 04/17/2020   HCT 20.5 (L) 04/17/2020   MCV 94.0 04/17/2020   PLT 143 (L) 04/17/2020   CMP Latest Ref Rng & Units 04/15/2020  Glucose 70 - 99 mg/dL 06/15/2020)  BUN 6 - 20 mg/dL 8  Creatinine 974(B - 6.38 mg/dL 4.53  Sodium 6.46 - 803 mmol/L 136  Potassium 3.5 - 5.1 mmol/L 3.6  Chloride 98 - 111 mmol/L 108  CO2 22 - 32 mmol/L 21(L)  Calcium 8.9 - 10.3 mg/dL 6.6(Y)  Total Protein 6.5 - 8.1 g/dL 5.9(L)  Total Bilirubin 0.3 - 1.2 mg/dL 0.6  Alkaline Phos 38 - 126 U/L 109  AST 15 - 41 U/L 21  ALT 0 - 44 U/L 17   Edinburgh Postnatal Depression Scale Screening Tool 04/17/2020 04/16/2020  I have been able to laugh and see the funny side of things. 0 (No Data)  I have looked forward with enjoyment to things. 0 -  I have blamed myself unnecessarily when things went wrong. 1 -  I have been anxious or worried for no good reason. 2 -  I have felt scared or panicky for no good reason. 1 -  Things have been getting on top of me. 0 -  I have been so unhappy that I have had difficulty sleeping. 0 -  I have felt sad or miserable. 1 -  I have been so unhappy that I have been crying. 1 -  The thought of harming myself has occurred to me. 0 -  Edinburgh Postnatal Depression Scale  Total 6 -     Discharge instruction:  per After Visit Summary,  [facility] booklet and  "Understanding Mother & Baby Care" hospital booklet  After Visit Meds:  Allergies as of 04/18/2020   No Known Allergies     Medication List    STOP taking these medications   aspirin EC 81 MG tablet   ferrous sulfate 325 (65 FE) MG tablet   NIFEdipine 30 MG 24 hr tablet Commonly known as: PROCARDIA-XL/NIFEDICAL-XL     TAKE these medications   acetaminophen 500 MG tablet Commonly known as: TYLENOL Take 2 tablets (1,000 mg total) by mouth every 6 (six) hours.   coconut oil Oil Apply 1 application topically as needed.   famotidine 20 MG tablet Commonly known as: PEPCID Take 20 mg by mouth daily.   ibuprofen 800 MG tablet Commonly known as: ADVIL Take 1 tablet (800 mg total) by mouth every 6 (six) hours.   iron polysaccharides 150 MG capsule Commonly known as: Ferrex 150 Take 1 capsule (150 mg total) by mouth daily.   Magnesium Oxide 400 (240 Mg) MG Tabs Take 1 tablet (400 mg total) by mouth daily. For prevention of constipation.   oxyCODONE 5 MG immediate release tablet Commonly known as: Oxy IR/ROXICODONE Take 1-2 tablets (5-10 mg total) by mouth every 4 (four) hours as needed for up to 7 days for moderate pain or severe pain.   prenatal multivitamin Tabs tablet Take 1 tablet by mouth daily at 12 noon.   senna-docusate 8.6-50 MG tablet Commonly known as: Senokot-S Take 2 tablets by mouth daily. Start taking on: April 19, 2020   sertraline 50 MG tablet Commonly known as: ZOLOFT Take 50 mg by mouth daily.   simethicone 80 MG chewable tablet Commonly known as: MYLICON Chew 1 tablet (80 mg total) by mouth as needed for flatulence.       Diet: iron rich diet  Activity: Advance as tolerated. Pelvic rest for 6 weeks.   Postpartum contraception: Not Discussed  Newborn Data: Live born female  Birth Weight: 7 lb 6.2 oz (3350 g) APGAR: 8, 9  Newborn Delivery    Birth date/time: 04/16/2020 05:45:00 Delivery type: C-Section, Low Transverse Trial of labor: Yes C-section categorization: Primary      named Piper Baby Feeding: Breast Disposition:home with mother    Delivery Report:  Review the Delivery Report for details.  Follow up:  Follow-up Information    Amado Nash Candace Gallus, MD. Schedule an appointment as soon as possible for a visit in 2 week(s).   Specialty: Obstetrics and Gynecology Why: post-op follow up for BP and mood [facility] home visit for BP check in 1 week.  Contact information: [address] [place] [address] [phone]                 Signed: Cipriano Mile, MSN 04/18/2020, 4:40 PM

## 2020-04-18 NOTE — Discharge Instructions (Signed)
Lactation outpatient support - home visit  Linda Coppola RN, MHA, IBCLC at [facility]: Lactation Consultant  [email or web address] Mail: [email or web address] Tel: [phone]    Additional resources:  [facility] [email or web address]   Chiropractic specialist   Dr. Leanna Hastings [email or web address]  Craniosacral therapy for baby  Erin Balkind  [email or web address]  

## 2020-04-18 NOTE — Lactation Note (Signed)
This note was copied from a baby's chart. Lactation Consultation Note  Patient Name: Mikayla Schroeder [id number] Date: 04/18/2020 Reason for consult: Follow-up assessment Age:42 hours   LC Follow Up Visit:  Family has been discharged.  Mother had a consult today, however, engorgement prevention/treatment was not reviewed at that time.  Reviewed with mother prior to discharge.  She has a manual pump and a DEBP for home use.  RN in room to prepare for patient escort out of the hospital.  Parents have our OP phone number for any further concerns.  Mother also has an LC that she worked with at her pediatrician's office with the first child.  She plans to use her services again as needed.   Maternal Data    Feeding    LATCH Score                    Lactation Tools Discussed/Used    Interventions    Discharge Discharge Education: Engorgement and breast care  Consult Status Consult Status: Complete Date: 04/18/20 Follow-up type: Call as needed    Alonia Dibuono R Terrill Alperin 04/18/2020, 5:14 PM

## 2020-04-22 NOTE — Anesthesia Postprocedure Evaluation (Signed)
Anesthesia Post Note  Patient: Mikayla Schroeder  Procedure(s) Performed: CESAREAN SECTION (N/A )     Anesthesia Type: Epidural Anesthetic complications: no   No complications documented.  Last Vitals:  Vitals:   04/17/20 2205 04/18/20 0600  BP: (!) 109/59 117/70  Pulse: 61 (!) 54  Resp: 16 18  Temp: 37 C 36.8 C  SpO2: 99% 100%    Last Pain:  Vitals:   04/18/20 1400  TempSrc:   PainSc: 0-No pain                 Mellody Dance

## 2020-04-23 ENCOUNTER — Inpatient Hospital Stay (HOSPITAL_COMMUNITY)
Admission: AD | Admit: 2020-04-23 | Discharge: 2020-04-25 | DRG: 776 | Disposition: A | Discharge status: Home / Self Care | Payer: BC Managed Care – PPO | Attending: Obstetrics & Gynecology | Admitting: Obstetrics & Gynecology

## 2020-04-23 ENCOUNTER — Other Ambulatory Visit: Payer: Self-pay

## 2020-04-23 DIAGNOSIS — O115 Pre-existing hypertension with pre-eclampsia, complicating the puerperium: Secondary | ICD-10-CM | POA: Diagnosis present

## 2020-04-23 DIAGNOSIS — O1003 Pre-existing essential hypertension complicating the puerperium: Secondary | ICD-10-CM | POA: Diagnosis present

## 2020-04-23 DIAGNOSIS — F419 Anxiety disorder, unspecified: Secondary | ICD-10-CM | POA: Diagnosis present

## 2020-04-23 DIAGNOSIS — O9081 Anemia of the puerperium: Secondary | ICD-10-CM | POA: Diagnosis present

## 2020-04-23 DIAGNOSIS — R519 Headache, unspecified: Secondary | ICD-10-CM | POA: Diagnosis present

## 2020-04-23 DIAGNOSIS — O99345 Other mental disorders complicating the puerperium: Secondary | ICD-10-CM | POA: Diagnosis present

## 2020-04-23 DIAGNOSIS — F53 Postpartum depression: Secondary | ICD-10-CM | POA: Diagnosis present

## 2020-04-23 DIAGNOSIS — O119 Pre-existing hypertension with pre-eclampsia, unspecified trimester: Secondary | ICD-10-CM | POA: Diagnosis present

## 2020-04-23 MED ORDER — CALCIUM CARBONATE ANTACID 500 MG PO CHEW: 2.0000 | CHEWABLE_TABLET | ORAL | Status: DC | PRN

## 2020-04-23 MED ORDER — MAGNESIUM SULFATE BOLUS VIA INFUSION
4.0000 g | Freq: Once | INTRAVENOUS | Status: AC
  Administered 2020-04-23: 4 g via INTRAVENOUS
  Filled 2020-04-23: qty 1000

## 2020-04-23 MED ORDER — BUTALBITAL-APAP-CAFFEINE 50-325-40 MG PO TABS
1.0000 | ORAL_TABLET | Freq: Four times a day (QID) | ORAL | Status: DC | PRN
  Administered 2020-04-23 – 2020-04-25 (×2): 1 via ORAL
  Filled 2020-04-23 (×2): qty 1

## 2020-04-23 MED ORDER — IBUPROFEN 600 MG PO TABS
600.0000 mg | ORAL_TABLET | Freq: Four times a day (QID) | ORAL | Status: DC
  Administered 2020-04-23 – 2020-04-25 (×8): 600 mg via ORAL
  Filled 2020-04-23 (×8): qty 1

## 2020-04-23 MED ORDER — PRENATAL MULTIVITAMIN CH
1.0000 | ORAL_TABLET | Freq: Every day | ORAL | Status: DC
  Administered 2020-04-24 – 2020-04-25 (×2): 1 via ORAL
  Filled 2020-04-23 (×2): qty 1

## 2020-04-23 MED ORDER — LACTATED RINGERS IV SOLN: INTRAVENOUS | Status: DC

## 2020-04-23 MED ORDER — MAGNESIUM SULFATE 40 GM/1000ML IV SOLN
1.0000 g/h | INTRAVENOUS | Status: AC
  Administered 2020-04-23 – 2020-04-24 (×2): 2 g/h via INTRAVENOUS
  Filled 2020-04-23 (×2): qty 1000

## 2020-04-23 NOTE — H&P (Addendum)
Mikayla Schroeder is an 40 y.o. female with Chronic HTN, delivered by emerg C/s 1 wk back, is readmitted with HA,  elevated BP, elevated AST/ALT in office today.  She has been on Procardia the entire pregnancy but stopped for few days postpartum due to low BPs. She started having a throbbing HA, esp left eye- on 4/7, called office on 4/8, also reported BPs 149-150/90s. She has been taking 4000 mg Tylenol daily since 4 days.  Denies diarrhea/ sick contact. Baby doing well.   No LMP recorded.    Past Medical History:  Diagnosis Date  . Anxiety   . Excessive sweating   . Family history of brain cancer   . Family history of breast cancer   . Family history of kidney cancer   . Family history of ovarian cancer   . Family history of uterine cancer   . Heart murmur   . Hormone disorder    Hypothyroid  . Hypertension     Past Surgical History:  Procedure Laterality Date  . CESAREAN SECTION N/A 04/16/2020   Procedure: CESAREAN SECTION;  Surgeon: Vick Frees, MD;  Location: [facility] LD ORS;  Service: Obstetrics;  Laterality: N/A;  . REMOVAL OF HEMANGIOMA   1981  . TONSILLECTOMY  2006   TURBINATE REDUCTION  . WISDOM TOOTH EXTRACTION  2011    Family History  Problem Relation Age of Onset  . Diabetes Mother   . Breast cancer Mother 40  . Uterine cancer Mother 59  . Kidney cancer Father 19  . Heart failure Maternal Grandmother   . Stroke Maternal Grandmother   . Diabetes Maternal Grandmother   . Ovarian cancer Paternal Grandmother        dx in 16's, died in her 47's  . Brain cancer Other   . Cancer Cousin        type unk    Social History:  reports that she has never smoked. She has never used smokeless tobacco. She reports previous alcohol use. She reports that she does not use drugs.  Allergies: No Known Allergies  Medications Prior to Admission  Medication Sig Dispense Refill Last Dose  . acetaminophen (TYLENOL) 500 MG tablet Take 2 tablets (1,000 mg total) by mouth every 6  (six) hours. 30 tablet 0   . coconut oil OIL Apply 1 application topically as needed.  0   . famotidine (PEPCID) 20 MG tablet Take 20 mg by mouth daily.     Marland Kitchen ibuprofen (ADVIL) 800 MG tablet Take 1 tablet (800 mg total) by mouth every 6 (six) hours. 30 tablet 0   . iron polysaccharides (FERREX 150) 150 MG capsule Take 1 capsule (150 mg total) by mouth daily.     . Magnesium Oxide 400 (240 Mg) MG TABS Take 1 tablet (400 mg total) by mouth daily. For prevention of constipation. 30 tablet    . oxyCODONE (OXY IR/ROXICODONE) 5 MG immediate release tablet Take 1-2 tablets (5-10 mg total) by mouth every 4 (four) hours as needed for up to 7 days for moderate pain or severe pain. 30 tablet 0   . Prenatal Vit-Fe Fumarate-FA (PRENATAL MULTIVITAMIN) TABS tablet Take 1 tablet by mouth daily at 12 noon.     . senna-docusate (SENOKOT-S) 8.6-50 MG tablet Take 2 tablets by mouth daily.     . sertraline (ZOLOFT) 50 MG tablet Take 50 mg by mouth daily.     . simethicone (MYLICON) 80 MG chewable tablet Chew 1 tablet (80 mg total) by  mouth as needed for flatulence. 30 tablet 0     Review of Systems  Blood pressure 122/70, pulse (!) 58, temperature 98 F (36.7 C), temperature source Oral, resp. rate 17, SpO2 100 %, unknown if currently breastfeeding. Physical Exam Physical exam:  A&O x 3, no acute distress. Pleasant HEENT neg, no thyromegaly Lungs CTA bilat CV RRR, S1S2 normal Abdo soft, non tender, non acute Extr no edema/ tenderness Pelvic def   No results found for this or any previous visit (from the past 24 hour(s)).  Assessment/Plan: Essential HTN with potpartum 1 week, with headache,elevated LFTs, possible atypical superimposed PEC. HTN stable, Procardia 30mg  XL restarted 4 days back. No new antiHTN med added, will monitor Weight checks, daily IO reassess CBC. CMP, Mag level tomo  04/23/2020, 5:30 PM

## 2020-04-24 ENCOUNTER — Encounter (HOSPITAL_COMMUNITY): Payer: Self-pay | Admitting: Obstetrics & Gynecology

## 2020-04-24 LAB — CBC
HCT: 23.1 % — ABNORMAL LOW (ref 36.0–46.0)
Hemoglobin: 7.7 g/dL — ABNORMAL LOW (ref 12.0–15.0)
MCH: 32.9 pg (ref 26.0–34.0)
MCHC: 33.3 g/dL (ref 30.0–36.0)
MCV: 98.7 fL (ref 80.0–100.0)
Platelets: 291 10*3/uL (ref 150–400)
RBC: 2.34 MIL/uL — ABNORMAL LOW (ref 3.87–5.11)
RDW: 13.2 % (ref 11.5–15.5)
WBC: 12.9 10*3/uL — ABNORMAL HIGH (ref 4.0–10.5)
nRBC: 0 % (ref 0.0–0.2)

## 2020-04-24 LAB — COMPREHENSIVE METABOLIC PANEL
ALT: 62 U/L — ABNORMAL HIGH (ref 0–44)
AST: 31 U/L (ref 15–41)
Albumin: 2.5 g/dL — ABNORMAL LOW (ref 3.5–5.0)
Alkaline Phosphatase: 90 U/L (ref 38–126)
Anion gap: 5 (ref 5–15)
BUN: 8 mg/dL (ref 6–20)
CO2: 25 mmol/L (ref 22–32)
Calcium: 6.8 mg/dL — ABNORMAL LOW (ref 8.9–10.3)
Chloride: 106 mmol/L (ref 98–111)
Creatinine, Ser: 0.6 mg/dL (ref 0.44–1.00)
GFR, Estimated: >60 mL/min (ref >60)
Glucose, Bld: 102 mg/dL — ABNORMAL HIGH (ref 70–99)
Potassium: 3.9 mmol/L (ref 3.5–5.1)
Sodium: 136 mmol/L (ref 135–145)
Total Bilirubin: 0.3 mg/dL (ref 0.3–1.2)
Total Protein: 5.3 g/dL — ABNORMAL LOW (ref 6.5–8.1)

## 2020-04-24 LAB — MAGNESIUM: Magnesium: 5.9 mg/dL — ABNORMAL HIGH (ref 1.7–2.4)

## 2020-04-24 MED ORDER — SERTRALINE HCL 50 MG PO TABS
50.0000 mg | ORAL_TABLET | Freq: Every day | ORAL | Status: DC
  Administered 2020-04-24 – 2020-04-25 (×2): 50 mg via ORAL
  Filled 2020-04-24 (×2): qty 1

## 2020-04-24 MED ORDER — NIFEDIPINE ER OSMOTIC RELEASE 30 MG PO TB24
30.0000 mg | ORAL_TABLET | Freq: Every day | ORAL | Status: DC
  Administered 2020-04-24 – 2020-04-25 (×2): 30 mg via ORAL
  Filled 2020-04-24 (×2): qty 1

## 2020-04-24 MED ORDER — SODIUM CHLORIDE 0.9 % IV SOLN
500.0000 mg | Freq: Once | INTRAVENOUS | Status: AC
  Administered 2020-04-24: 500 mg via INTRAVENOUS
  Filled 2020-04-24: qty 25

## 2020-04-24 NOTE — Progress Notes (Signed)
HD #2, readmission for PP superimposed preeclampsia   Subjective: "I dont feel good". Pt also reported same to RN. No SOB/ CP but is worried she wont breath in her sleep, feels her body is too weak. All since Magnesium, reviewed reflexes are normal and Mag at 5.9 is not toxic level but can decrease to 1 gm/hr to complete until 4 pm. Agrees  H/H low d/w pt, cannot repeat IV Iron since just got one last week HA only behind left eye. Reports nl MRI Head early in pregnancy for HA, no aneurysm. Denies vision changes.  NO h/o sinusitis or allergies in past  Pt tearful since doesn't feel too well   Objective: Vital signs in last 24 hours: Temp:  [97.4 F (36.3 C)-98.5 F (36.9 C)] 97.4 F (36.3 C) (04/12 1121) Pulse Rate:  [46-70] 59 (04/12 1121) Resp:  [14-18] 16 (04/12 1121) BP: (114-134)/(60-78) 124/66 (04/12 1121) SpO2:  [95 %-100 %] 98 % (04/12 1121) Weight:  [79.5 kg] 79.5 kg (04/12 0413) Weight change:     Intake/Output from previous day: 04/11 0701 - 04/12 0700 In: 2138.5 [P.O.:1010; I.V.:1128.5] Out: 3600 [Urine:3600] Intake/Output this shift: Total I/O In: 907.8 [P.O.:600; I.V.:307.8] Out: 1500 [Urine:1500]  General appearance: alert and cooperative Resp: clear to auscultation bilaterally Cardio: regular rate and rhythm, S1, S2 normal, no murmur, click, rub or gallop Extremities: Homans sign is negative, no sign of DVT and DTR +2/+2  C/s scar intact, no erythema   CBC Latest Ref Rng & Units 04/24/2020 04/17/2020 04/15/2020  WBC 4.0 - 10.5 K/uL 12.9(H) 19.7(H) 9.6  Hemoglobin 12.0 - 15.0 g/dL 7.7(L) 7.3(L) 10.8(L)  Hematocrit 36.0 - 46.0 % 23.1(L) 20.5(L) 30.5(L)  Platelets 150 - 400 K/uL 291 143(L) 154   CMP Latest Ref Rng & Units 04/24/2020 04/15/2020 01/13/2012  Glucose 70 - 99 mg/dL 193(X) 902(I) 097(D)  BUN 6 - 20 mg/dL 8 8 12   Creatinine 0.44 - 1.00 mg/dL 5.32 9.92  Sodium 135 - 145 mmol/L 136 136 140  Potassium 3.5 - 5.1 mmol/L 3.9 3.6 3.4(L)  Chloride 98 - 111  mmol/L 106 108 103  CO2 22 - 32 mmol/L 25 21(L) 27  Calcium 8.9 - 10.3 mg/dL 4.26) 8.3(M) 8.8  Total Protein 6.5 - 8.1 g/dL 5.3(L) 5.9(L) 6.2  Total Bilirubin 0.3 - 1.2 mg/dL 0.3 0.6 0.3  Alkaline Phos 38 - 126 U/L 90 109 89  AST 15 - 41 U/L 31 21 14   ALT 0 - 44 U/L 62(H) 17 16   office AST/ ALT on 4/11 were 36/ 84-- slightly better today   Assessment/Plan: HD #2 readmission for PP superimposed preeclampsia and headache -CHTN- BPs wnl, on Procardia 30 mg XL, continue daily -PEC- ALT improving. I/O good, On Magnesium, not tolerating well though exam nl, level 5.9 will continue at 1 gm/hr until 4 pm, reassess how she feels off mag and see BPs and consider D/C home tomorrow  -Heaache- cluster- behind left eye. No vision issues. Fiorecet helped more that Motrin, try warm compress -Breast feeding- offered hosp pump if baby leaves with husband  -Anxiety/depression- contin Zoloft  -Anemia- s/p IV iron. H/H 7.7, reports not feeling to well since baby, not sure if now worse since on Magnesium. Accepts blood transfusion if needed. Need to reassess how she feels off Magnesium and transfuse 1 unit if needed   04/24/2020, 12:11 PM Patient ID: Mikayla Schroeder, female   DOB: 08/16/1980, 40 y.o.   MRN: 01/06/[id number]

## 2020-04-24 NOTE — Plan of Care (Signed)
  Problem: Clinical Measurements: Goal: Ability to maintain clinical measurements within normal limits will improve Outcome: Progressing   Problem: Activity: Goal: Risk for activity intolerance will decrease Outcome: Progressing   Problem: Nutrition: Goal: Adequate nutrition will be maintained Outcome: Progressing   Problem: Education: Goal: Knowledge of disease or condition will improve Outcome: Progressing Goal: Knowledge of the prescribed therapeutic regimen will improve Outcome: Progressing   Problem: Fluid Volume: Goal: Peripheral tissue perfusion will improve Outcome: Progressing   Problem: Clinical Measurements: Goal: Complications related to disease process, condition or treatment will be avoided or minimized Outcome: Progressing

## 2020-04-25 NOTE — Progress Notes (Signed)
HD #3, readmission for PP superimposed preeclampsia   Subjective: "No SOB/ CP . Feeling better. HA only behind left eye. Reports nl MRI Head early in pregnancy for HA, no aneurysm. Denies vision changes.  NO h/o sinusitis or allergies in past  Pt tearful since doesn't feel too well   Objective: Vital signs in last 24 hours: Temp:  [97.4 F (36.3 C)-98.5 F (36.9 C)] 98 F (36.7 C) (04/13 1053) Pulse Rate:  [44-59] 54 (04/13 1053) Resp:  [16-18] 18 (04/13 1053) BP: (117-128)/(58-72) 117/66 (04/13 1053) SpO2:  [98 %-100 %] 100 % (04/13 1053) Weight:  [78.9 kg] 78.9 kg (04/13 0500) Weight change: 0 kg    Intake/Output from previous day: 04/12 0701 - 04/13 0700 In: 2218.7 [P.O.:1440; I.V.:530.6; IV Piggyback:248.1] Out: 5000 [Urine:5000] Intake/Output this shift: Total I/O In: -  Out: 200 [Urine:200]  General appearance: alert and cooperative Resp: clear to auscultation bilaterally Cardio: regular rate and rhythm, S1, S2 normal, no murmur, click, rub or gallop Extremities: Homans sign is negative, no sign of DVT and DTR +2/+2  C/s scar intact, no erythema   CBC Latest Ref Rng & Units 04/24/2020 04/17/2020 04/15/2020  WBC 4.0 - 10.5 K/uL 12.9(H) 19.7(H) 9.6  Hemoglobin 12.0 - 15.0 g/dL 7.7(L) 7.3(L) 10.8(L)  Hematocrit 36.0 - 46.0 % 23.1(L) 20.5(L) 30.5(L)  Platelets 150 - 400 K/uL 291 143(L) 154   CMP Latest Ref Rng & Units 04/24/2020 04/15/2020 01/13/2012  Glucose 70 - 99 mg/dL 696(V) 893(Y) 101(B)  BUN 6 - 20 mg/dL 8 8 12   Creatinine 0.44 - 1.00 mg/dL 5.10 2.58  Sodium 135 - 145 mmol/L 136 136 140  Potassium 3.5 - 5.1 mmol/L 3.9 3.6 3.4(L)  Chloride 98 - 111 mmol/L 106 108 103  CO2 22 - 32 mmol/L 25 21(L) 27  Calcium 8.9 - 10.3 mg/dL 5.27) 7.8(E) 8.8  Total Protein 6.5 - 8.1 g/dL 5.3(L) 5.9(L) 6.2  Total Bilirubin 0.3 - 1.2 mg/dL 0.3 0.6 0.3  Alkaline Phos 38 - 126 U/L 90 109 89  AST 15 - 41 U/L 31 21 14   ALT 0 - 44 U/L 62(H) 17 16   office AST/ ALT on 4/11 were  36/ 84-- slightly better  Assessment/Plan: HD #3 readmission for PP superimposed preeclampsia and headache -CHTN- BPs wnl, on Procardia 30 mg XL, continue daily -PEC- ALT improving. I/O good D/C home   -Heaache- cluster- behind left eye. No vision issues. Fiorecet helped more that Motrin, try warm compress -Breast feeding- offered hosp pump if baby leaves with husband  -Anxiety/depression- continue Zoloft  -Anemia- s/p IV iron. H/H 7.7, - continue PO Fe and fu office one week  Mikayla Schroeder 04/25/2020, 11:17 AM Patient ID: Mikayla Schroeder, female   DOB: 1980-01-27, 40 y.o.   MRN: [id number]

## 2020-04-26 NOTE — Discharge Summary (Signed)
OB Discharge Summary  Patient Name: Mikayla Schroeder DOB: Aug 25, 1980 MRN: [id number]  Date of admission: 04/23/2020 Delivering provider: This patient has no babies on file.  Admitting diagnosis: Chronic hypertension with superimposed preeclampsia [O11.9] Intrauterine pregnancy: Unknown     Secondary diagnosis: Patient Active Problem List   Diagnosis Date Noted  . Chronic hypertension with superimposed preeclampsia 04/23/2020  . Maternal anemia, with delivery - IDA w/ ABL 04/17/2020  . Status post primary low transverse cesarean section NRFHTs 04/16/2020  . Postpartum care following cesarean delivery 4/4 04/16/2020  . Chronic hypertension 04/16/2020  . Encounter for induction of labor 04/15/2020  . Anxiety       Date of discharge: 04/26/2020   Discharge diagnosis: Active Problems:   Chronic hypertension with superimposed preeclampsia                                                               Hospital course:  Magnesium sulfate for sz prophylaxis and bP control Tolerated well and diuresed.   Physical exam  Vitals:   04/25/20 0348 04/25/20 0500 04/25/20 0747 04/25/20 1053  BP: 117/62  (!) 126/58 117/66  Pulse: (!) 44  (!) 52 (!) 54  Resp: 16  18 18   Temp: 98 F (36.7 C)  98.1 F (36.7 C) 98 F (36.7 C)  TempSrc: Oral  Oral Oral  SpO2: 100%  100% 100%  Weight:  78.9 kg    Height:       General: alert Lochia: appropriate Uterine Fundus: firm Incision: Dressing is clean, dry, and intact Perineum: repair na, na edema DVT Evaluation: Negative Homan's sign. Labs: Lab Results  Component Value Date   WBC 12.9 (H) 04/24/2020   HGB 7.7 (L) 04/24/2020   HCT 23.1 (L) 04/24/2020   MCV 98.7 04/24/2020   PLT 291 04/24/2020   CMP Latest Ref Rng & Units 04/24/2020  Glucose 70 - 99 mg/dL 06/24/2020)  BUN 6 - 20 mg/dL 8  Creatinine 741(U - 3.84 mg/dL 5.36  Sodium 4.68 - 032 mmol/L 136  Potassium 3.5 - 5.1 mmol/L 3.9  Chloride 98 - 111 mmol/L 106  CO2 22 - 32 mmol/L 25  Calcium  8.9 - 10.3 mg/dL 122)  Total Protein 6.5 - 8.1 g/dL 5.3(L)  Total Bilirubin 0.3 - 1.2 mg/dL 0.3  Alkaline Phos 38 - 126 U/L 90  AST 15 - 41 U/L 31  ALT 0 - 44 U/L 62(H)   Edinburgh Postnatal Depression Scale Screening Tool 04/17/2020 04/16/2020  I have been able to laugh and see the funny side of things. 0 (No Data)  I have looked forward with enjoyment to things. 0 -  I have blamed myself unnecessarily when things went wrong. 1 -  I have been anxious or worried for no good reason. 2 -  I have felt scared or panicky for no good reason. 1 -  Things have been getting on top of me. 0 -  I have been so unhappy that I have had difficulty sleeping. 0 -  I have felt sad or miserable. 1 -  I have been so unhappy that I have been crying. 1 -  The thought of harming myself has occurred to me. 0 -  Edinburgh Postnatal Depression Scale Total 6 -     Discharge  instruction:  per After Visit Summary,  [facility] booklet and  "Understanding Mother & Baby Care" hospital booklet  After Visit Meds:  Allergies as of 04/25/2020   No Known Allergies     Medication List    TAKE these medications   acetaminophen 500 MG tablet Commonly known as: TYLENOL Take 2 tablets (1,000 mg total) by mouth every 6 (six) hours.   coconut oil Oil Apply 1 application topically as needed.   famotidine 20 MG tablet Commonly known as: PEPCID Take 20 mg by mouth daily.   ibuprofen 800 MG tablet Commonly known as: ADVIL Take 1 tablet (800 mg total) by mouth every 6 (six) hours.   iron polysaccharides 150 MG capsule Commonly known as: Ferrex 150 Take 1 capsule (150 mg total) by mouth daily.   Magnesium Oxide 400 (240 Mg) MG Tabs Take 1 tablet (400 mg total) by mouth daily. For prevention of constipation.   NIFEdipine 30 MG 24 hr tablet Commonly known as: PROCARDIA-XL/NIFEDICAL-XL Take 30 mg by mouth daily.   prenatal multivitamin Tabs tablet Take 1 tablet by mouth daily at 12 noon.   senna-docusate  8.6-50 MG tablet Commonly known as: Senokot-S Take 2 tablets by mouth daily.   sertraline 50 MG tablet Commonly known as: ZOLOFT Take 50 mg by mouth daily.   simethicone 80 MG chewable tablet Commonly known as: MYLICON Chew 1 tablet (80 mg total) by mouth as needed for flatulence.     ASK your doctor about these medications   oxyCODONE 5 MG immediate release tablet Commonly known as: Oxy IR/ROXICODONE Take 1-2 tablets (5-10 mg total) by mouth every 4 (four) hours as needed for up to 7 days for moderate pain or severe pain. Ask about: Should I take this medication?       Diet: routine diet  Activity: Advance as tolerated. Pelvic rest for 6 weeks.   Postpartum contraception: None        Signed: Milbert Coulter, MSN 04/26/2020, 6:41 AM

## 2020-05-12 ENCOUNTER — Inpatient Hospital Stay (HOSPITAL_COMMUNITY): Payer: BC Managed Care – PPO

## 2020-05-12 ENCOUNTER — Inpatient Hospital Stay (HOSPITAL_COMMUNITY)
Admission: AD | Admit: 2020-05-12 | Discharge: 2020-05-12 | Disposition: A | Discharge status: Home / Self Care | Payer: BC Managed Care – PPO | Attending: Obstetrics and Gynecology | Admitting: Obstetrics and Gynecology

## 2020-05-12 ENCOUNTER — Encounter (HOSPITAL_COMMUNITY): Payer: Self-pay | Admitting: Obstetrics and Gynecology

## 2020-05-12 ENCOUNTER — Other Ambulatory Visit: Payer: Self-pay

## 2020-05-12 LAB — CBC WITH DIFFERENTIAL/PLATELET
Abs Immature Granulocytes: 0.05 10*3/uL (ref 0.00–0.07)
Basophils Absolute: 0.1 10*3/uL (ref 0.0–0.1)
Basophils Relative: 1 %
Eosinophils Absolute: 0.2 10*3/uL (ref 0.0–0.5)
Eosinophils Relative: 2 %
HCT: 32.5 % — ABNORMAL LOW (ref 36.0–46.0)
Hemoglobin: 10.6 g/dL — ABNORMAL LOW (ref 12.0–15.0)
Immature Granulocytes: 1 %
Lymphocytes Relative: 16 %
Lymphs Abs: 1.5 10*3/uL (ref 0.7–4.0)
MCH: 31.5 pg (ref 26.0–34.0)
MCHC: 32.6 g/dL (ref 30.0–36.0)
MCV: 96.7 fL (ref 80.0–100.0)
Monocytes Absolute: 0.5 10*3/uL (ref 0.1–1.0)
Monocytes Relative: 6 %
Neutro Abs: 6.9 10*3/uL (ref 1.7–7.7)
Neutrophils Relative %: 74 %
Platelets: 275 10*3/uL (ref 150–400)
RBC: 3.36 MIL/uL — ABNORMAL LOW (ref 3.87–5.11)
RDW: 12.2 % (ref 11.5–15.5)
WBC: 9.2 10*3/uL (ref 4.0–10.5)
nRBC: 0 % (ref 0.0–0.2)

## 2020-05-12 NOTE — MAU Note (Signed)
Pt reports to mau with c/o heavy vag bleeding since last night. Pt reports she had one gush of blood last night and passed golf ball sized clot.  Pt states bleeding lightened up but upon waking up today she has began bleeding heavily again and still passing clots.  Denies any pain today.

## 2020-05-12 NOTE — MAU Provider Note (Signed)
History     CSN: [id number]  Arrival date and time: 05/12/20 1412   Event Date/Time   First Provider Initiated Contact with Patient 05/12/20 1516      Chief Complaint  Patient presents with  . Vaginal Bleeding   HPI  Mikayla Schroeder is a 40 y.o. [id number] at nearly 4 weeks postpartum who presents with vaginal bleeding. Had c/section for NRFHT on 4/4. Since then was readmitted on 4/14 for postpartum preeclampsia. Since delivery; her lochia decreased to intermittent clear/brown/red discharge. Heavier bleeding since last night. Has had several episodes of feeling a gush of bleeding - she saturates a pad and passes a large blood clot then bleeding resolves for several hours. Denies fever or abdominal pain & denies intercourse since delivery. States she has not been more active than normal - has taken some short stroller walks with baby and did some light housework yesterday.  No other complaints.   OB History    Gravida  3   Para  2   Term  2   Preterm      AB  1   Living  2     SAB  1   IAB      Ectopic      Multiple  0   Live Births  2           Past Medical History:  Diagnosis Date  . Anxiety   . Excessive sweating   . Heart murmur   . Hormone disorder    Hypothyroid  . Hypertension     Past Surgical History:  Procedure Laterality Date  . CESAREAN SECTION N/A 04/16/2020   Procedure: CESAREAN SECTION;  Surgeon: Vick Frees, MD;  Location: [facility];  Service: Obstetrics;  Laterality: N/A;  . REMOVAL OF HEMANGIOMA   1981  . TONSILLECTOMY  2006   TURBINATE REDUCTION  . WISDOM TOOTH EXTRACTION  2011    Family History  Problem Relation Age of Onset  . Diabetes Mother   . Breast cancer Mother 73  . Uterine cancer Mother 70  . Kidney cancer Father 39  . Heart failure Maternal Grandmother   . Stroke Maternal Grandmother   . Diabetes Maternal Grandmother   . Ovarian cancer Paternal Grandmother        dx in 62's, died in her 66's  . Brain cancer  Other   . Cancer Cousin        type unk    Social History   Tobacco Use  . Smoking status: Never Smoker  . Smokeless tobacco: Never Used  Substance Use Topics  . Alcohol use: Not Currently    Comment: 1-2x/week  . Drug use: No    Allergies: No Known Allergies  Medications Prior to Admission  Medication Sig Dispense Refill Last Dose  . acetaminophen (TYLENOL) 500 MG tablet Take 2 tablets (1,000 mg total) by mouth every 6 (six) hours. 30 tablet 0   . Butalbital-APAP-Caffeine 50-300-40 MG CAPS Take 1 capsule by mouth every 4 (four) hours.     . coconut oil OIL Apply 1 application topically as needed.  0   . famotidine (PEPCID) 20 MG tablet Take 20 mg by mouth daily.     Marland Kitchen ibuprofen (ADVIL) 800 MG tablet Take 1 tablet (800 mg total) by mouth every 6 (six) hours. 30 tablet 0   . iron polysaccharides (FERREX 150) 150 MG capsule Take 1 capsule (150 mg total) by mouth daily.     . Magnesium  Oxide 400 (240 Mg) MG TABS Take 1 tablet (400 mg total) by mouth daily. For prevention of constipation. 30 tablet    . NIFEdipine (PROCARDIA-XL/NIFEDICAL-XL) 30 MG 24 hr tablet Take 30 mg by mouth daily.     . Prenatal Vit-Fe Fumarate-FA (PRENATAL MULTIVITAMIN) TABS tablet Take 1 tablet by mouth daily at 12 noon.     . senna-docusate (SENOKOT-S) 8.6-50 MG tablet Take 2 tablets by mouth daily.     . sertraline (ZOLOFT) 50 MG tablet Take 50 mg by mouth daily.     . simethicone (MYLICON) 80 MG chewable tablet Chew 1 tablet (80 mg total) by mouth as needed for flatulence. 30 tablet 0     Review of Systems  Constitutional: Negative.   Gastrointestinal: Negative.   Genitourinary: Positive for vaginal bleeding.   Physical Exam   Blood pressure (!) 119/55, pulse 74, temperature 98.1 F (36.7 C), temperature source Oral, resp. rate 17, SpO2 100 %, unknown if currently breastfeeding.  Physical Exam Vitals and nursing note reviewed. Exam conducted with a chaperone present.  Constitutional:       General: She is not in acute distress.    Appearance: Normal appearance.  HENT:     Head: Normocephalic and atraumatic.  Pulmonary:     Effort: Pulmonary effort is normal.  Abdominal:     General: Abdomen is flat. There is no distension.     Tenderness: There is no abdominal tenderness.     Comments: C/section wound well healed  Genitourinary:    General: Normal vulva.     Exam position: Lithotomy position.     Comments: Minimal amount of dark red blood oozing from os Skin:    General: Skin is warm and dry.  Neurological:     Mental Status: She is alert.  Psychiatric:        Mood and Affect: Mood normal.        Behavior: Behavior normal.     MAU Course  Procedures Results for orders placed or performed during the hospital encounter of 05/12/20 (from the past 24 hour(s))  CBC with Differential/Platelet     Status: Abnormal   Collection Time: 05/12/20  3:51 PM  Result Value Ref Range   WBC 9.2 4.0 - 10.5 K/uL   RBC 3.36 (L) 3.87 - 5.11 MIL/uL   Hemoglobin 10.6 (L) 12.0 - 15.0 g/dL   HCT 02.5 (L) 42.7 - 06.2 %   MCV 96.7 80.0 - 100.0 fL   MCH 31.5 26.0 - 34.0 pg   MCHC 32.6 30.0 - 36.0 g/dL   RDW 37.6 28.3 - 15.1 %   Platelets 275 150 - 400 K/uL   nRBC 0.0 0.0 - 0.2 %   Neutrophils Relative % 74 %   Neutro Abs 6.9 1.7 - 7.7 K/uL   Lymphocytes Relative 16 %   Lymphs Abs 1.5 0.7 - 4.0 K/uL   Monocytes Relative 6 %   Monocytes Absolute 0.5 0.1 - 1.0 K/uL   Eosinophils Relative 2 %   Eosinophils Absolute 0.2 0.0 - 0.5 K/uL   Basophils Relative 1 %   Basophils Absolute 0.1 0.0 - 0.1 K/uL   Immature Granulocytes 1 %   Abs Immature Granulocytes 0.05 0.00 - 0.07 K/uL   US PELVIS LIMITED (TRANSABDOMINAL ONLY)  Result Date: 05/12/2020 CLINICAL DATA:  40 year old female with postpartum bleeding. C-section on 04/16/2020 EXAM: LIMITED ULTRASOUND OF PELVIS TECHNIQUE: Limited transabdominal ultrasound examination of the pelvis was performed. COMPARISON:  None. FINDINGS: A 4.5 x 3  x 2.2 cm hyperechoic structure along the mid-lower endometrial canal, containing internal vascular flow compatible with retained products of conception. No other uterine abnormalities are noted. The ovaries bilaterally are unremarkable. No free fluid or adnexal mass noted. IMPRESSION: 1. 4.5 x 3 x 2.2 cm hyperechoic structure along the mid-lower endometrial canal with internal vascular flow compatible with retained products of conception. 2. Normal ovaries. Electronically Signed   By: Harmon Pier M.D.   On: 05/12/2020 17:16    MDM Patient presents with heavy vaginal bleeding at 4 weeks postpartum. Her vital signs are stable. She has no other complaints. Minimal bleeding on exam. Hemoglobin is up to 10.6 from 7.7 after delivery.   Ultrasound shows 4.5x3x2.2 cm structure with internal vascular flow c/w retained products of conception.   Dr. Alysia Penna aware of patient presentation & results.   S/w Dr. Billy Coast. Patient is stable for outpatient management. Pt to call office Monday morning to schedule close f/u early this week with Dr. Amado Nash - he will send message to Dr. Amado Nash regarding patient.   Assessment and Plan   1. Retained products of conception after delivery without hemorrhage   2. Postpartum bleeding    -reviewed bleeding & infection precautions & reasons to return to MAU -call [facility] on Monday morning to schedule appt with Dr. Nicholas Lose 05/12/2020, 3:16 PM

## 2020-05-12 NOTE — Discharge Instructions (Signed)
Return to care   If you have heavier bleeding that soaks through more that 2 pads per hour for an hour or more  If you bleed so much that you feel like you might pass out or you do pass out  If you have significant abdominal pain that is not improved with Tylenol   You have fever over 100.5

## 2020-06-16 ENCOUNTER — Other Ambulatory Visit: Payer: Self-pay

## 2020-06-16 ENCOUNTER — Emergency Department (INDEPENDENT_AMBULATORY_CARE_PROVIDER_SITE_OTHER)
Admission: RE | Admit: 2020-06-16 | Discharge: 2020-06-16 | Disposition: A | Discharge status: Home / Self Care | Payer: BC Managed Care – PPO | Source: Ambulatory Visit | Attending: Family Medicine | Admitting: Family Medicine

## 2020-06-16 VITALS — BP 116/82 | HR 66 | Temp 98.2°F | Ht 63.0 in | Wt 161.0 lb

## 2020-06-16 DIAGNOSIS — J029 Acute pharyngitis, unspecified: Secondary | ICD-10-CM | POA: Diagnosis not present

## 2020-06-16 DIAGNOSIS — R509 Fever, unspecified: Secondary | ICD-10-CM

## 2020-06-16 LAB — POCT RAPID STREP A (OFFICE): Rapid Strep A Screen: NEGATIVE

## 2020-06-16 NOTE — Discharge Instructions (Addendum)
You may use over the counter ibuprofen or acetaminophen as needed.  For a sore throat, over the counter products such as [employer] Peroxyl Mouth Sore Rinse or Chloraseptic Sore Throat Spray may provide some temporary relief. Your rapid strep test was negative today. We have sent your throat swab for culture and will let you know of any positive results. 

## 2020-06-16 NOTE — ED Triage Notes (Signed)
x4 days. Pt states that she has a sore throat, nasal congestion, body aches and nasal congestion. Pt states that she is vaccinated. Pt states that she had 2 covid test this week which were both negative.

## 2020-06-18 LAB — COVID-19, FLU A+B NAA
Influenza A, NAA: NOT DETECTED
Influenza B, NAA: NOT DETECTED
SARS-CoV-2, NAA: DETECTED — AB

## 2020-06-18 NOTE — ED Provider Notes (Addendum)
[facility]   [id number] 06/16/20 Arrival Time: 1301  ASSESSMENT & PLAN:  1. Sore throat   2. Fever, unspecified fever cause    COVID-19 testing sent.  Rapid strep negative.  OTC symptom care as needed.   Follow-up Information    [facility] at [facility].   Specialty: Urgent Care Why: As needed. Contact information: [address] [place] [address] [phone]              Reviewed expectations re: course of current medical issues. Questions answered. Outlined signs and symptoms indicating need for more acute intervention. Understanding verbalized. After Visit Summary given.   SUBJECTIVE: History from: patient. Mikayla Schroeder is a 40 y.o. female who reports sore throat, nasal congestion, body aches. Few days. Wants to r/o strep throat. Two negative home COVID tests. Denies: fever and difficulty breathing. Normal PO intake without n/v/d.    OBJECTIVE:  Vitals:   06/16/20 1356 06/16/20 1359  BP:  116/82  Pulse:  66  Temp:  98.2 F (36.8 C)  SpO2:  100%  Weight: 73 kg   Height: 5\' 3"  (1.6 m)     General appearance: alert; no distress Eyes: PERRLA; EOMI; conjunctiva normal HENT: [place]; AT; with mild nasal congestion; throat with mild cobblestoning Neck: supple  Lungs: speaks full sentences without difficulty; unlabored Extremities: no edema Skin: warm and dry Neurologic: normal gait Psychological: alert and cooperative; normal mood and affect   No Known Allergies  Past Medical History:  Diagnosis Date  . Anxiety   . Excessive sweating   . Heart murmur   . Hormone disorder    Hypothyroid  . Hypertension    Social History   Socioeconomic History  . Marital status: Significant Other    Spouse name: Not on file  . Number of children: Not on file  . Years of education: Not on file  . Highest education level: Not on file  Occupational History  . Not on file  Tobacco Use  . Smoking status:  Never Smoker  . Smokeless tobacco: Never Used  Substance and Sexual Activity  . Alcohol use: Not Currently    Comment: 1-2x/week  . Drug use: No  . Sexual activity: Yes    Birth control/protection: None  Other Topics Concern  . Not on file  Social History Narrative  . Not on file   Social Determinants of Health   Financial Resource Strain: Not on file  Food Insecurity: Not on file  Transportation Needs: Not on file  Physical Activity: Not on file  Stress: Not on file  Social Connections: Not on file  Intimate Partner Violence: Not on file   Family History  Problem Relation Age of Onset  . Diabetes Mother   . Breast cancer Mother 50  . Uterine cancer Mother 42  . Kidney cancer Father 74  . Heart failure Maternal Grandmother   . Stroke Maternal Grandmother   . Diabetes Maternal Grandmother   . Ovarian cancer Paternal Grandmother        dx in 33's, died in her 19's  . Brain cancer Other   . Cancer Cousin        type unk   Past Surgical History:  Procedure Laterality Date  . CESAREAN SECTION N/A 04/16/2020   Procedure: CESAREAN SECTION;  Surgeon: 06/16/2020, MD;  Location: [facility] LD ORS;  Service: Obstetrics;  Laterality: N/A;  . REMOVAL OF HEMANGIOMA   1981  . TONSILLECTOMY  2006   TURBINATE REDUCTION  . WISDOM TOOTH EXTRACTION  2011     Mardella Layman, MD 06/18/20 5993    Mardella Layman, MD 06/18/20 [phone]

## 2020-06-21 LAB — CULTURE, GROUP A STREP: Strep A Culture: NEGATIVE

## 2020-07-17 ENCOUNTER — Ambulatory Visit (INDEPENDENT_AMBULATORY_CARE_PROVIDER_SITE_OTHER): Payer: BC Managed Care – PPO

## 2020-07-17 ENCOUNTER — Ambulatory Visit (INDEPENDENT_AMBULATORY_CARE_PROVIDER_SITE_OTHER): Payer: BC Managed Care – PPO | Admitting: Podiatry

## 2020-07-17 ENCOUNTER — Encounter: Payer: Self-pay | Admitting: Podiatry

## 2020-07-17 ENCOUNTER — Other Ambulatory Visit: Payer: Self-pay

## 2020-07-17 DIAGNOSIS — M205X1 Other deformities of toe(s) (acquired), right foot: Secondary | ICD-10-CM | POA: Diagnosis not present

## 2020-07-17 DIAGNOSIS — M778 Other enthesopathies, not elsewhere classified: Secondary | ICD-10-CM | POA: Diagnosis not present

## 2020-07-17 DIAGNOSIS — M2061 Acquired deformities of toe(s), unspecified, right foot: Secondary | ICD-10-CM

## 2020-07-17 NOTE — Patient Instructions (Signed)
You can use "Voltaren gel" on the area ?

## 2020-07-20 NOTE — Progress Notes (Signed)
Subjective:   Patient ID: Mikayla Schroeder, female   DOB: 40 y.o.   MRN: [id number]   HPI 40 year old female presents the office today for concerns of pain to her right big toe joint which is been ongoing greater than 6 months.  She says it hurts with shoes and pressure particular she wears a heel.  She states that she gets pain range of motion particular she flexes her toe all the way down she feels that she cannot bring the toe all the way up.  No recent treatment.  No recent injury.  She does like to run but she is currently doing more walking as she just had a baby.  No other concerns.  She is currently breast-feeding her 80-month-old.   Review of Systems  All other systems reviewed and are negative.  Past Medical History:  Diagnosis Date   Anxiety    Excessive sweating    Heart murmur    Hormone disorder    Hypothyroid   Hypertension     Past Surgical History:  Procedure Laterality Date   CESAREAN SECTION N/A 04/16/2020   Procedure: CESAREAN SECTION;  Surgeon: Vick Frees, MD;  Location: [facility];  Service: Obstetrics;  Laterality: N/A;   REMOVAL OF HEMANGIOMA   1981   TONSILLECTOMY  2006   TURBINATE REDUCTION   WISDOM TOOTH EXTRACTION  2011     Current Outpatient Medications:    magnesium oxide (MAG-OX) 400 MG tablet, Take by mouth., Disp: , Rfl:    NIFEdipine (ADALAT CC) 30 MG 24 hr tablet, TAKE 1 TABLET DAILY.  Patient must have office visit before next refill., Disp: , Rfl:    acetaminophen (TYLENOL) 500 MG tablet, Take 2 tablets (1,000 mg total) by mouth every 6 (six) hours., Disp: 30 tablet, Rfl: 0   ALPRAZolam (XANAX) 0.5 MG tablet, Take by mouth., Disp: , Rfl:    amoxicillin-clavulanate (AUGMENTIN) 875-125 MG tablet, SMARTSIG:1 Tablet(s) By Mouth Every 12 Hours, Disp: , Rfl:    Butalbital-APAP-Caffeine 50-300-40 MG CAPS, Take 1 capsule by mouth every 4 (four) hours., Disp: , Rfl:    coconut oil OIL, Apply 1 application topically as needed., Disp: , Rfl: 0    Drospirenone (SLYND) 4 MG TABS, Take 1 tablet by mouth daily., Disp: , Rfl:    famotidine (PEPCID) 20 MG tablet, Take 20 mg by mouth daily., Disp: , Rfl:    ibuprofen (ADVIL) 800 MG tablet, Take 1 tablet (800 mg total) by mouth every 6 (six) hours., Disp: 30 tablet, Rfl: 0   iron polysaccharides (FERREX 150) 150 MG capsule, Take 1 capsule (150 mg total) by mouth daily., Disp: , Rfl:    Magnesium Oxide 400 (240 Mg) MG TABS, Take 1 tablet (400 mg total) by mouth daily. For prevention of constipation., Disp: 30 tablet, Rfl:    misoprostol (CYTOTEC) 200 MCG tablet, PLEASE SEE ATTACHED FOR DETAILED DIRECTIONS, Disp: , Rfl:    NIFEdipine (PROCARDIA-XL/NIFEDICAL-XL) 30 MG 24 hr tablet, Take 30 mg by mouth daily., Disp: , Rfl:    oxyCODONE-acetaminophen (PERCOCET/ROXICET) 5-325 MG tablet, PLEASE SEE ATTACHED FOR DETAILED DIRECTIONS, Disp: , Rfl:    Prenatal Vit-Fe Fumarate-FA (PRENATAL MULTIVITAMIN) TABS tablet, Take 1 tablet by mouth daily at 12 noon., Disp: , Rfl:    promethazine (PHENERGAN) 25 MG tablet, Take by mouth., Disp: , Rfl:    senna-docusate (SENOKOT-S) 8.6-50 MG tablet, Take 2 tablets by mouth daily., Disp: , Rfl:    sertraline (ZOLOFT) 50 MG tablet, Take 50 mg  by mouth daily., Disp: , Rfl:    simethicone (MYLICON) 80 MG chewable tablet, Chew 1 tablet (80 mg total) by mouth as needed for flatulence., Disp: 30 tablet, Rfl: 0  No Known Allergies        Objective:  Physical Exam  General: AAO x3, NAD  Dermatological: Skin is warm, dry and supple bilateral.  There are no open sores, no preulcerative lesions, no rash or signs of infection present.  Vascular: Dorsalis Pedis artery and Posterior Tibial artery pedal pulses are 2/4 bilateral with immedate capillary fill time. Pedal hair growth present. There is no pain with calf compression, swelling, warmth, erythema.   Neruologic: Grossly intact via light touch bilateral.   Musculoskeletal: There is decreasing to motion in dorsiflexion of  the right first MPJ.  Tenderness of the dorsal first MPJ as well in the right side.  Trace edema is present but there is no erythema or warmth.  No other areas of tenderness identified today.  Muscular strength 5/5 in all groups tested bilateral.  Gait: Unassisted, Nonantalgic.       Assessment:   Hallux limitus right side, capsulitis     Plan:  -Treatment options discussed including all alternatives, risks, and complications -Etiology of symptoms were discussed -X-rays were obtained and reviewed with the patient.  Arthritic changes present the first MPJ with dorsal spurring of the first metatarsal.  No evidence of acute fracture. -We discussed with conservative as well as surgical treatment options.  We are going to start with conservative treatment.  Discussed shoe modifications.  She has good shoes and over-the-counter inserts.  We will try to do her straight cut out intermedius of the pad that she can apply to her inserts.  If needed she can bring her inserts and I can work on this for her as well.  Discussed anti-inflammatories, Voltaren gel.  Steroid injection in future if needed.  Vivi Barrack DPM

## 2021-04-16 ENCOUNTER — Emergency Department (INDEPENDENT_AMBULATORY_CARE_PROVIDER_SITE_OTHER)
Admission: RE | Admit: 2021-04-16 | Discharge: 2021-04-16 | Disposition: A | Discharge status: Home / Self Care | Payer: BC Managed Care – PPO | Source: Ambulatory Visit

## 2021-04-16 VITALS — BP 114/73 | HR 61 | Temp 98.8°F | Resp 15 | Ht 63.0 in | Wt 150.0 lb

## 2021-04-16 DIAGNOSIS — K122 Cellulitis and abscess of mouth: Secondary | ICD-10-CM

## 2021-04-16 DIAGNOSIS — J029 Acute pharyngitis, unspecified: Secondary | ICD-10-CM | POA: Diagnosis not present

## 2021-04-16 LAB — POCT RAPID STREP A (OFFICE): Rapid Strep A Screen: NEGATIVE

## 2021-04-16 MED ORDER — AMOXICILLIN-POT CLAVULANATE 875-125 MG PO TABS: 1.0000 | ORAL_TABLET | Freq: Two times a day (BID) | ORAL | 0 refills | Status: AC

## 2021-04-16 NOTE — ED Provider Notes (Signed)
?[facility] ? ? ? ?CSN: [id number] ?Arrival date & time: 04/16/21  1150 ? ? ?  ? ?History   ?Chief Complaint ?Chief Complaint  ?Patient presents with  ? Sore Throat  ? ? ?HPI ?Mikayla Schroeder is a 41 y.o. female.  ? ?HPI 41 year old female presents with sore throat for 1 week.  Patient reports recovering from recent stomach bug and wants to rule out strep as she is going to [place] next week. ? ?Past Medical History:  ?Diagnosis Date  ? Anxiety   ? Excessive sweating   ? Heart murmur   ? Hormone disorder   ? Hypothyroid  ? Hypertension   ? ? ?Patient Active Problem List  ? Diagnosis Date Noted  ? Chronic hypertension with superimposed preeclampsia 04/23/2020  ? Maternal anemia, with delivery - IDA w/ ABL 04/17/2020  ? Status post primary low transverse cesarean section NRFHTs 04/16/2020  ? Postpartum care following cesarean delivery 4/4 04/16/2020  ? Chronic hypertension 04/16/2020  ? Encounter for induction of labor 04/15/2020  ? Headache associated with orgasm 08/12/2019  ? Anxiety   ? ? ?Past Surgical History:  ?Procedure Laterality Date  ? CESAREAN SECTION N/A 04/16/2020  ? Procedure: CESAREAN SECTION;  Surgeon: Vick Frees, MD;  Location: [facility] LD ORS;  Service: Obstetrics;  Laterality: N/A;  ? REMOVAL OF HEMANGIOMA   1981  ? TONSILLECTOMY  2006  ? TURBINATE REDUCTION  ? WISDOM TOOTH EXTRACTION  2011  ? ? ?OB History   ? ? Gravida  ?3  ? Para  ?2  ? Term  ?2  ? Preterm  ?   ? AB  ?1  ? Living  ?2  ?  ? ? SAB  ?1  ? IAB  ?   ? Ectopic  ?   ? Multiple  ?0  ? Live Births  ?2  ?   ?  ?  ? ? ? ?Home Medications   ? ?Prior to Admission medications   ?Medication Sig Start Date End Date Taking? Authorizing Provider  ?amoxicillin-clavulanate (AUGMENTIN) 875-125 MG tablet Take 1 tablet by mouth 2 (two) times daily for 7 days. 04/16/21 04/23/21 Yes Trevor Iha, FNP  ?Prenatal Vit-Fe Fumarate-FA (PRENATAL MULTIVITAMIN) TABS tablet Take 1 tablet by mouth daily at 12 noon.    [provider]   ?sertraline (ZOLOFT) 50 MG tablet Take 50 mg by mouth daily.    [provider]  ? ? ?Family History ?Family History  ?Problem Relation Age of Onset  ? Diabetes Mother   ? Breast cancer Mother 27  ? Uterine cancer Mother 39  ? Kidney cancer Father 70  ? Heart failure Maternal Grandmother   ? Stroke Maternal Grandmother   ? Diabetes Maternal Grandmother   ? Ovarian cancer Paternal Grandmother   ?     dx in 41's, died in her 57's  ? Brain cancer Other   ? Cancer Cousin   ?     type unk  ? ? ?Social History ?Social History  ? ?Tobacco Use  ? Smoking status: Never  ? Smokeless tobacco: Never  ?Vaping Use  ? Vaping Use: Never used  ?Substance Use Topics  ? Alcohol use: Not Currently  ?  Comment: 1-2x/week  ? Drug use: No  ? ? ? ?Allergies   ?Patient has no known allergies. ? ? ?Review of Systems ?Review of Systems  ?HENT:  Positive for sore throat.   ?All other systems reviewed and are negative. ? ? ?Physical  Exam ?Triage Vital Signs ?ED Triage Vitals  ?Enc Vitals Group  ?   BP 04/16/21 1230 114/73  ?   Pulse Rate 04/16/21 1230 61  ?   Resp 04/16/21 1230 15  ?   Temp 04/16/21 1230 98.8 ?F (37.1 ?C)  ?   Temp Source 04/16/21 1230 Oral  ?   SpO2 04/16/21 1230 98 %  ?   Weight 04/16/21 1232 150 lb (68 kg)  ?   Height 04/16/21 1232 5\' 3"  (1.6 m)  ?   Head Circumference --   ?   Peak Flow --   ?   Pain Score 04/16/21 1231 2  ?   Pain Loc --   ?   Pain Edu? --   ?   Excl. in GC? --   ? ?No data found. ? ?Updated Vital Signs ?BP 114/73 (BP Location: Right Arm)   Pulse 61   Temp 98.8 ?F (37.1 ?C) (Oral)   Resp 15   Ht 5\' 3"  (1.6 m)   Wt 150 lb (68 kg)   SpO2 98%   Breastfeeding Yes   BMI 26.57 kg/m?  ? ?  ? ?Physical Exam ?Vitals and nursing note reviewed.  ?Constitutional:   ?   Appearance: She is well-developed and normal weight.  ?HENT:  ?   Head: Normocephalic and atraumatic.  ?   Right Ear: Tympanic membrane, ear canal and external ear normal.  ?   Left Ear: Tympanic membrane, ear canal and external ear  normal.  ?   Mouth/Throat:  ?   Mouth: Mucous membranes are moist.  ?   Pharynx: Oropharynx is clear. Uvula midline. Posterior oropharyngeal erythema and uvula swelling present.  ?Eyes:  ?   Conjunctiva/sclera: Conjunctivae normal.  ?   Pupils: Pupils are equal, round, and reactive to light.  ?Cardiovascular:  ?   Rate and Rhythm: Normal rate and regular rhythm.  ?Pulmonary:  ?   Effort: Pulmonary effort is normal.  ?   Breath sounds: Normal breath sounds. No wheezing, rhonchi or rales.  ?Musculoskeletal:  ?   Cervical back: Normal range of motion and neck supple. No tenderness.  ?Lymphadenopathy:  ?   Cervical: Cervical adenopathy present.  ?Skin: ?   General: Skin is warm and dry.  ?Neurological:  ?   General: No focal deficit present.  ?   Mental Status: She is alert and oriented to person, place, and time.  ? ? ? ?UC Treatments / Results  ?Labs ?(all labs ordered are listed, but only abnormal results are displayed) ?Labs Reviewed  ?CULTURE, GROUP A STREP  ?POCT RAPID STREP A (OFFICE)  ? ? ?EKG ? ? ?Radiology ?No results found. ? ?Procedures ?Procedures (including critical care time) ? ?Medications Ordered in UC ?Medications - No data to display ? ?Initial Impression / Assessment and Plan / UC Course  ?I have reviewed the triage vital signs and the nursing notes. ? ?Pertinent labs & imaging results that were available during my care of the patient were reviewed by me and considered in my medical decision making (see chart for details). ? ?  ? ?MDM: 1.  Uvulitis-Rx'd Augmentin. Advised patient to take medication as directed with food to completion.  Encouraged patient to increase daily water intake while taking this medication.  Advised patient if symptoms worsen and/or unresolved please follow-up with PCP or here for further evaluation.  Patient discharged home, hemodynamically stable. ?Final Clinical Impressions(s) / UC Diagnoses  ? ?Final diagnoses:  ?Acute pharyngitis, unspecified  etiology  ?Uvulitis   ? ? ? ?Discharge Instructions   ? ?  ?Advised patient to take medication as directed with food to completion.  Encouraged patient to increase daily water intake while taking this medication.  Advised patient if symptoms worsen and/or unresolved please follow-up with PCP or here for further evaluation. ? ? ? ? ?ED Prescriptions   ? ? Medication Sig Dispense Auth. Provider  ? amoxicillin-clavulanate (AUGMENTIN) 875-125 MG tablet Take 1 tablet by mouth 2 (two) times daily for 7 days. 14 tablet Trevor Iha, FNP  ? ?  ? ?PDMP not reviewed this encounter. ?  ?Trevor Iha, FNP ?04/16/21 1323 ? ?

## 2021-04-16 NOTE — Discharge Instructions (Addendum)
Advised patient to take medication as directed with food to completion.  Encouraged patient to increase daily water intake while taking this medication.  Advised patient if symptoms worsen and/or unresolved please follow-up with PCP or here for further evaluation. 

## 2021-04-16 NOTE — ED Triage Notes (Signed)
Sore throat x 1 week ?Recovering from stomach bug  ?Wants to rule out strep  ?Going to [place] next week ? ?

## 2021-04-19 LAB — CULTURE, GROUP A STREP: Strep A Culture: NEGATIVE

## 2021-10-29 ENCOUNTER — Other Ambulatory Visit: Payer: Self-pay | Admitting: Obstetrics and Gynecology

## 2021-10-29 DIAGNOSIS — Z9189 Other specified personal risk factors, not elsewhere classified: Secondary | ICD-10-CM

## 2022-04-10 IMAGING — US US PELVIS LIMITED
1 series · 15 of 25 positions shown · non-contrast
Comparison: None.

CLINICAL DATA: 39-year-old female with postpartum bleeding.
C-section on 04/16/2020

EXAM:
LIMITED ULTRASOUND OF PELVIS
TECHNIQUE: Limited transabdominal ultrasound examination of the pelvis was
performed.

[Series 1: us pelvis limited · 15 of 37 slices shown]
[im 1/37]
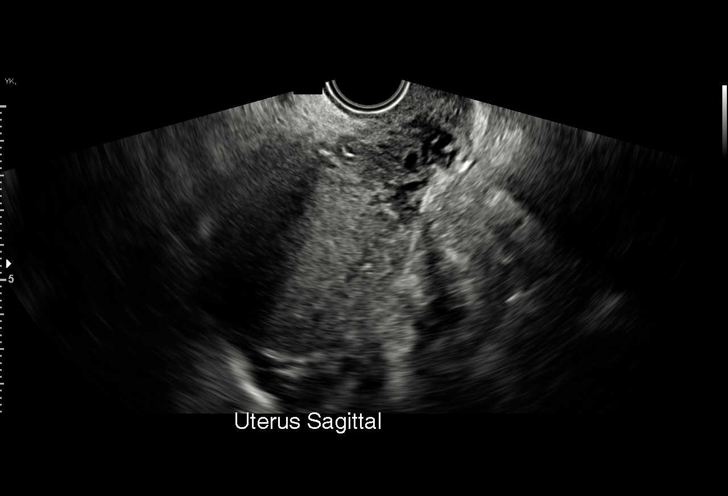
[im 4/37]
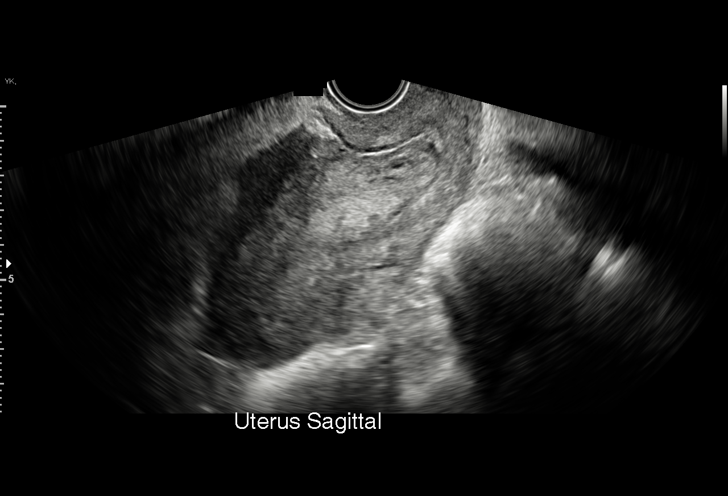
[im 7/37]
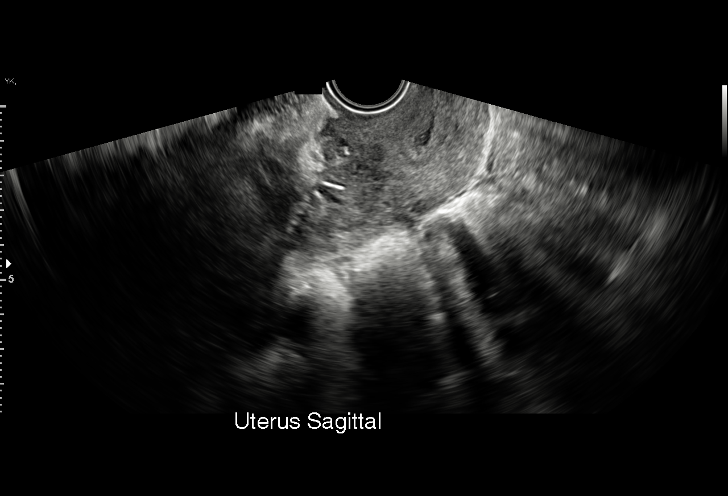
[im 8/37]
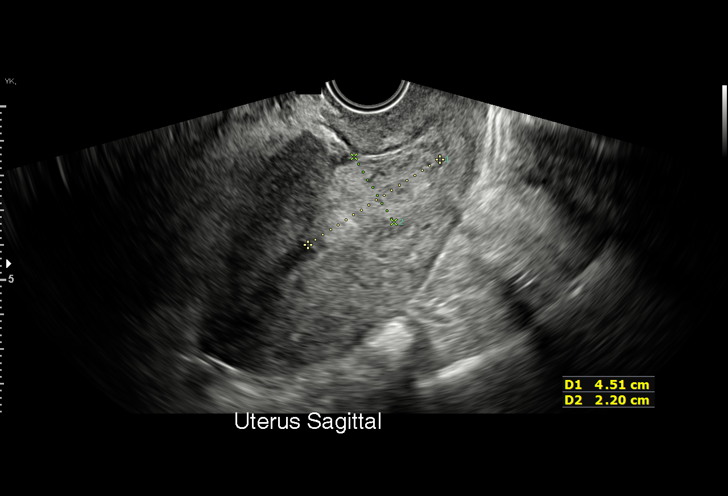
[im 11/37]
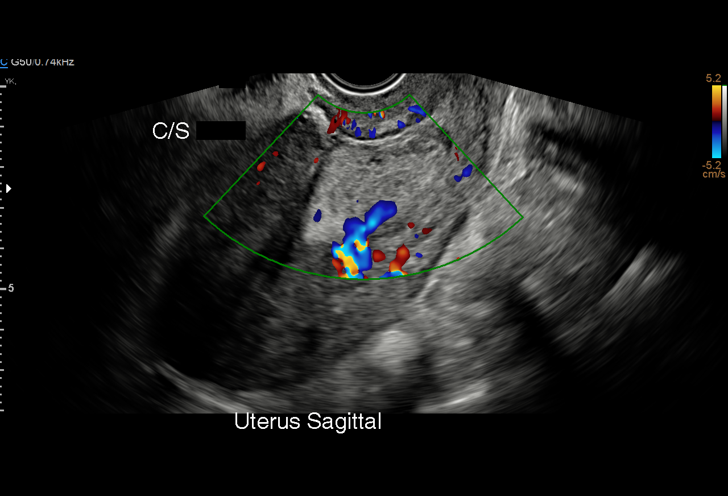
[im 14/37]
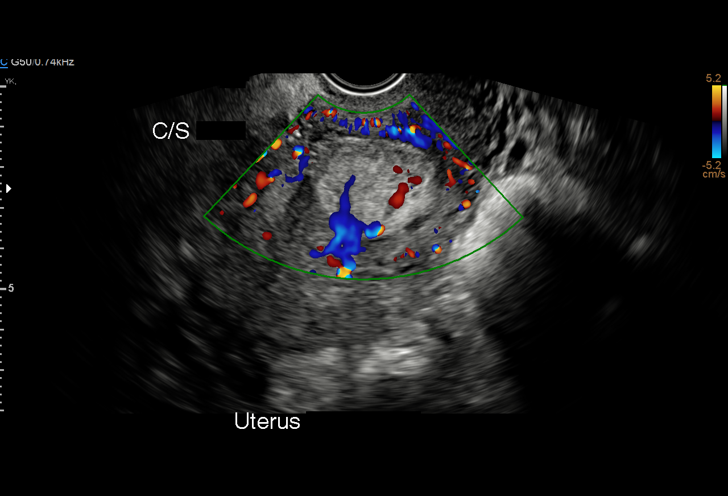
[im 16/37]
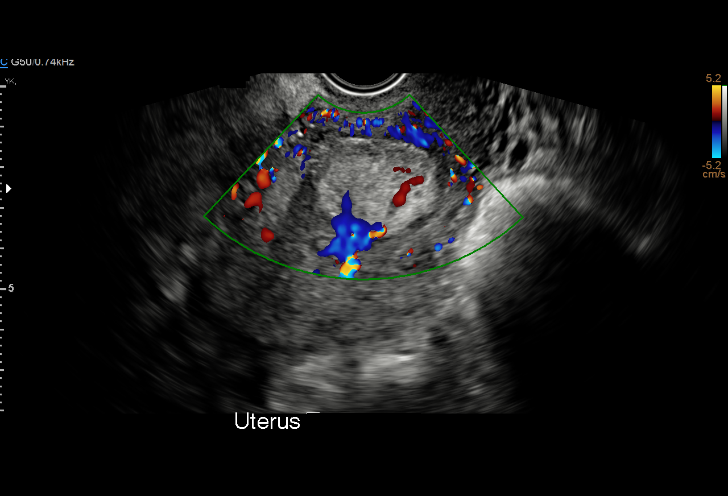
[im 19/37]
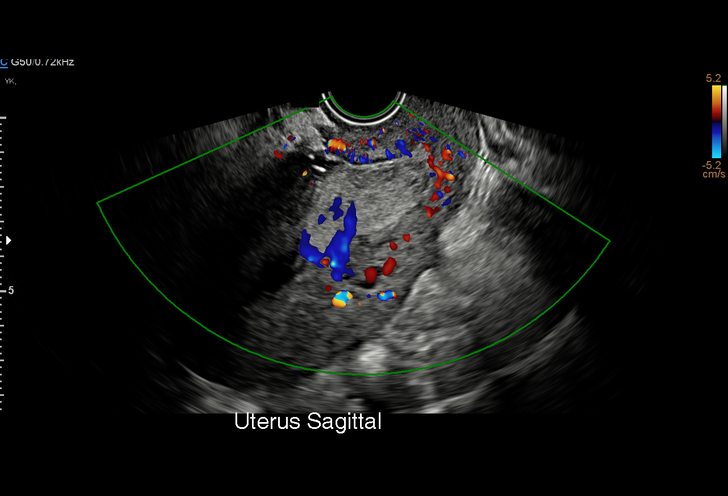
[im 22/37]
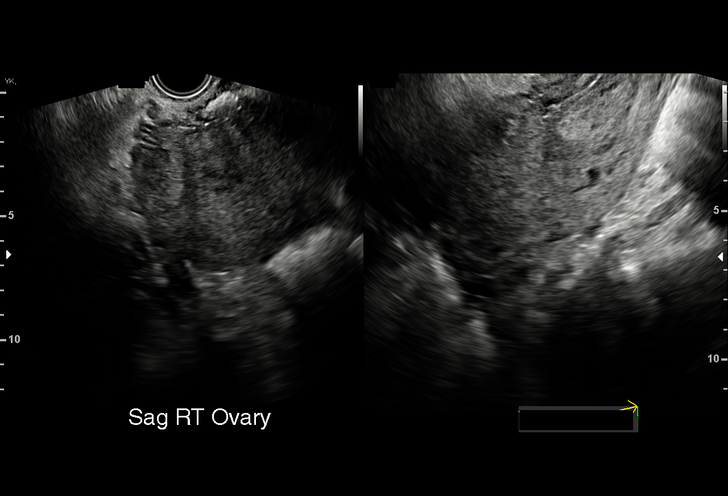
[im 23/37]
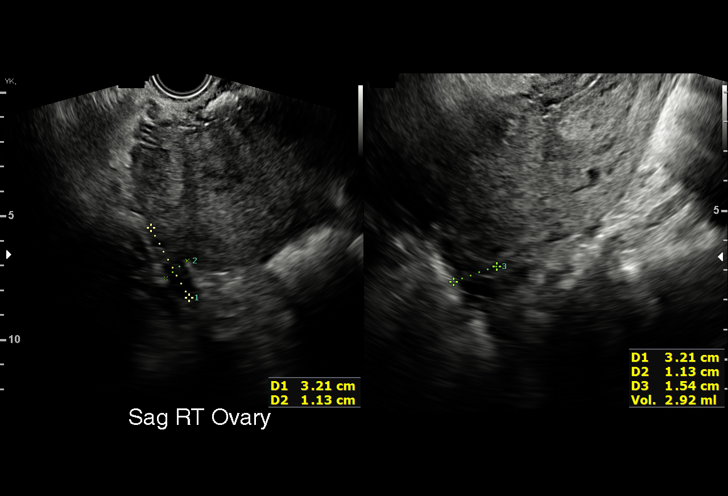
[im 26/37]
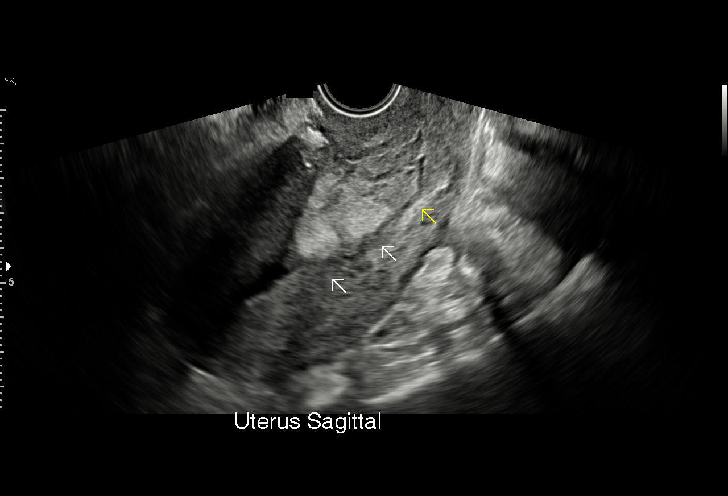
[im 29/37]
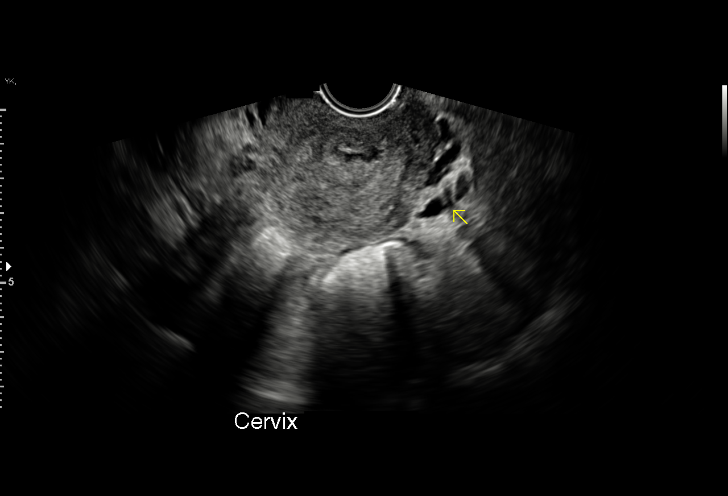
[im 31/37]
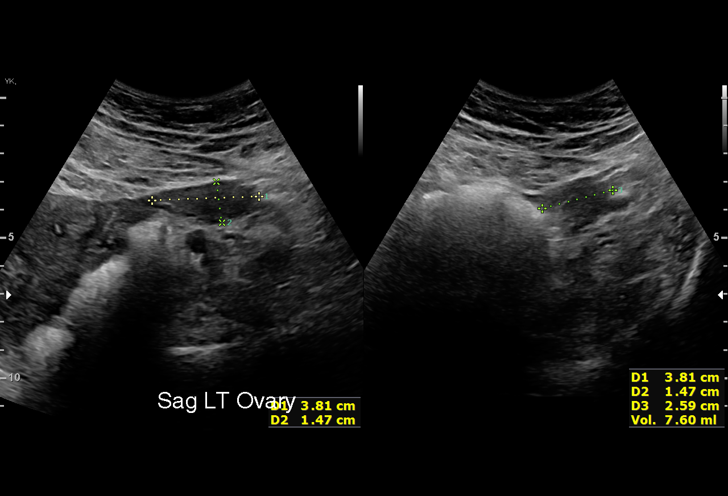
[im 34/37]
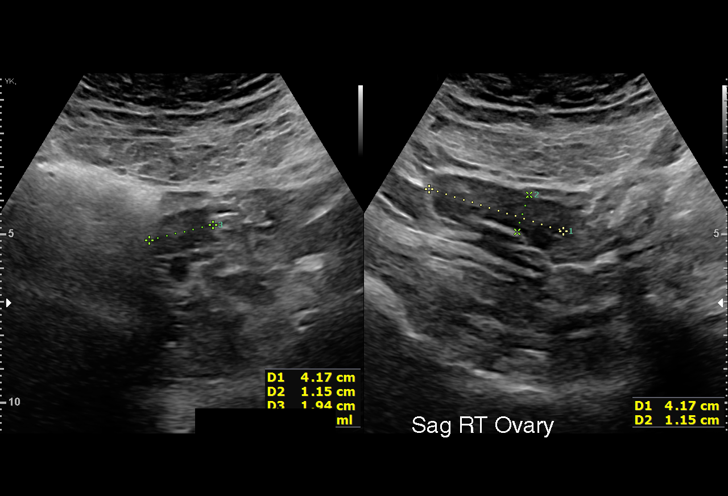
[im 37/37]
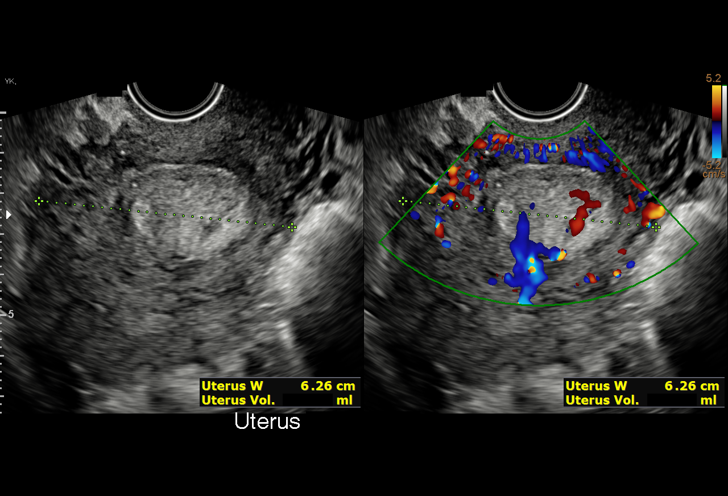

[15 of 25 positions shown; findings below may reference images not displayed]

FINDINGS: A 4.5 x 3 x 2.2 cm hyperechoic structure along the mid-lower
endometrial canal, containing internal vascular flow compatible with
retained products of conception.

No other uterine abnormalities are noted.

The ovaries bilaterally are unremarkable.

No free fluid or adnexal mass noted.
IMPRESSION: 1. 4.5 x 3 x 2.2 cm hyperechoic structure along the mid-lower
endometrial canal with internal vascular flow compatible with
retained products of conception.
2. Normal ovaries.

## 2022-08-08 ENCOUNTER — Emergency Department (HOSPITAL_BASED_OUTPATIENT_CLINIC_OR_DEPARTMENT_OTHER): Payer: BC Managed Care – PPO

## 2022-08-08 ENCOUNTER — Other Ambulatory Visit: Payer: Self-pay

## 2022-08-08 ENCOUNTER — Emergency Department (HOSPITAL_BASED_OUTPATIENT_CLINIC_OR_DEPARTMENT_OTHER)
Admission: EM | Admit: 2022-08-08 | Discharge: 2022-08-08 | Disposition: A | Discharge status: Home / Self Care | Payer: BC Managed Care – PPO | Attending: Emergency Medicine | Admitting: Emergency Medicine

## 2022-08-08 ENCOUNTER — Encounter (HOSPITAL_BASED_OUTPATIENT_CLINIC_OR_DEPARTMENT_OTHER): Payer: Self-pay

## 2022-08-08 DIAGNOSIS — M79651 Pain in right thigh: Secondary | ICD-10-CM | POA: Insufficient documentation

## 2022-08-08 NOTE — ED Triage Notes (Signed)
Pt arrives with c/o right leg pain that started a few days ago. Per pt, pain is in the lower part of right thigh to the top of her knee. Pt denies swelling or redness to area.

## 2022-08-08 NOTE — Discharge Instructions (Signed)
You were seen in the emergency department for your leg pain.  Your ultrasound showed no signs of blood clots and you have no signs of broken bones or torn ligaments.  You may be having some muscle cramps or spasms and you can take Tylenol and Motrin as needed for pain and make sure that you are drinking plenty of fluids.  You should try to stretch her muscles and ease back into your normal exercise to prevent an overuse injury.  You can follow-up with your primary doctor to have your symptoms rechecked.  You should return to the emergency department if you have significantly worsening pain and you are unable to walk, you have numbness or weakness in your leg or if you have any other new or concerning symptoms.

## 2022-08-08 NOTE — ED Provider Notes (Signed)
[place] [facility] Provider Note   CSN: [id number] Arrival date & time: 08/08/22  2012     History  Chief Complaint  Patient presents with   Leg Pain    Mikayla Schroeder is a 42 y.o. female.  Patient is a 42 year old female with past medical history of anxiety presenting to the emergency department with right thigh pain.  The patient states over the last few days she has had discomfort in her right anterior thigh.  She states that it felt like a muscle cramp or like "restless leg".  She states that today the pain got significantly worse and she initially went to urgent care who recommended that she come to ER to evaluate for possible DVT.  She states that she did have increased running before the pain started and felt sore after the run but did not have any falls or injuries.  She denies any numbness or weakness.  She denies any lower extremity swelling.  She denies any recent hospitalization or surgery, she states that she did recently fly to [facility] and drive 4 hours to the beach.  She denies any estrogen use or cancer history.  States that she is currently pain-free.  The history is provided by the patient.  Leg Pain      Home Medications Prior to Admission medications   Medication Sig Start Date End Date Taking? Authorizing Provider  Prenatal Vit-Fe Fumarate-FA (PRENATAL MULTIVITAMIN) TABS tablet Take 1 tablet by mouth daily at 12 noon.    [provider]  sertraline (ZOLOFT) 50 MG tablet Take 50 mg by mouth daily.    [provider]      Allergies    Patient has no known allergies.    Review of Systems   Review of Systems  Physical Exam Updated Vital Signs BP (!) 157/85 (BP Location: Left Arm)   Pulse (!) 46   Temp 98.1 F (36.7 C) (Oral)   Resp 16   Wt 66.7 kg   SpO2 100%   BMI 26.04 kg/m  Physical Exam Vitals and nursing note reviewed.  Constitutional:      General: She is not in acute distress.     Appearance: Normal appearance.  HENT:     Head: Normocephalic and atraumatic.     Nose: Nose normal.     Mouth/Throat:     Mouth: Mucous membranes are moist.     Pharynx: Oropharynx is clear.  Eyes:     Extraocular Movements: Extraocular movements intact.     Conjunctiva/sclera: Conjunctivae normal.  Cardiovascular:     Rate and Rhythm: Normal rate.     Pulses: Normal pulses.  Pulmonary:     Effort: Pulmonary effort is normal.  Musculoskeletal:        General: Normal range of motion.     Cervical back: Normal range of motion.     Comments: No bony tenderness to palpation of right lower extremity, no muscle tenderness, full knee extension/flexion and hip internal/external rotation intact without reproducible pain, no palpable knee joint effusion, no knee joint laxity, no overlying skin changes  Skin:    General: Skin is warm and dry.     Findings: No rash.  Neurological:     General: No focal deficit present.     Mental Status: She is alert and oriented to person, place, and time.     Sensory: No sensory deficit.     Motor: No weakness.  Psychiatric:  Mood and Affect: Mood normal.        Behavior: Behavior normal.     ED Results / Procedures / Treatments   Labs (all labs ordered are listed, but only abnormal results are displayed) Labs Reviewed - No data to display  EKG None  Radiology US Venous Img Lower Unilateral Right (DVT)  Result Date: 08/08/2022 CLINICAL DATA:  Right anterior thigh pain without swelling EXAM: Right LOWER EXTREMITY VENOUS DOPPLER ULTRASOUND TECHNIQUE: Gray-scale sonography with compression, as well as color and duplex ultrasound, were performed to evaluate the deep venous system(s) from the level of the common femoral vein through the popliteal and proximal calf veins. COMPARISON:  None Available. FINDINGS: VENOUS Normal compressibility of the common femoral, superficial femoral, and popliteal veins, as well as the visualized calf veins.  Visualized portions of profunda femoral vein and great saphenous vein unremarkable. No filling defects to suggest DVT on grayscale or color Doppler imaging. Doppler waveforms show normal direction of venous flow, normal respiratory plasticity and response to augmentation. Limited views of the contralateral common femoral vein are unremarkable. OTHER None. Limitations: none IMPRESSION: Negative. Electronically Signed   By: Minerva Fester M.D.   On: 08/08/2022 21:08    Procedures Procedures    Medications Ordered in ED Medications - No data to display  ED Course/ Medical Decision Making/ A&P                             Medical Decision Making This patient presents to the ED with chief complaint(s) of right thigh pain with pertinent past medical history of anxiety which further complicates the presenting complaint. The complaint involves an extensive differential diagnosis and also carries with it a high risk of complications and morbidity.    The differential diagnosis includes muscle cramp or spasm, no significant swelling though did have recent travel considering for possible DVT, no tenderness on exam making rhabdo unlikely, no trauma or falls making a fracture dislocation unlikely  Additional history obtained: Additional history obtained from N/A Records reviewed N/A  ED Course and Reassessment: On patient's arrival to the emergency department she is hemodynamically stable no acute distress.  She had a DVT study ordered from triage of her right lower extremity that was negative for DVT.  She has no bony tenderness and no signs of traumatic injury.  Favoring likely muscle cramp or spasm.  She was recommended continued symptomatic treatment, stretching and follow-up with her primary doctor and was given strict return precautions.  Independent labs interpretation:  N/A  Independent visualization of imaging: - I independently visualized the following imaging with scope of interpretation  limited to determining acute life threatening conditions related to emergency care: RLE DVT US, which revealed negative for DVT  Consultation: - Consulted or discussed management/test interpretation w/ external professional: N/A  Consideration for admission or further workup: Patient has no emergent conditions requiring admission or further work-up at this time and is stable for discharge home with primary care follow-up  Social Determinants of health: N/A            Final Clinical Impression(s) / ED Diagnoses Final diagnoses:  Right thigh pain    Rx / DC Orders ED Discharge Orders     None         Rexford Maus, DO 08/08/22 2142

## 2022-08-19 ENCOUNTER — Other Ambulatory Visit: Payer: BC Managed Care – PPO

## 2022-09-01 ENCOUNTER — Ambulatory Visit
Admission: RE | Admit: 2022-09-01 | Discharge: 2022-09-01 | Disposition: A | Discharge status: Home / Self Care | Payer: BC Managed Care – PPO | Source: Ambulatory Visit | Attending: Obstetrics and Gynecology | Admitting: Obstetrics and Gynecology

## 2022-09-01 DIAGNOSIS — Z9189 Other specified personal risk factors, not elsewhere classified: Secondary | ICD-10-CM

## 2022-09-01 MED ORDER — GADOPICLENOL 0.5 MMOL/ML IV SOLN
7.0000 mL | Freq: Once | INTRAVENOUS | Status: AC | PRN
  Administered 2022-09-01: 7 mL via INTRAVENOUS

## 2023-09-04 ENCOUNTER — Other Ambulatory Visit: Payer: Self-pay | Admitting: Obstetrics and Gynecology

## 2023-09-04 DIAGNOSIS — Z9189 Other specified personal risk factors, not elsewhere classified: Secondary | ICD-10-CM

## 2023-10-05 ENCOUNTER — Ambulatory Visit
Admission: RE | Admit: 2023-10-05 | Discharge: 2023-10-05 | Disposition: A | Discharge status: Home / Self Care | Source: Ambulatory Visit | Attending: Obstetrics and Gynecology | Admitting: Obstetrics and Gynecology

## 2023-10-05 DIAGNOSIS — Z9189 Other specified personal risk factors, not elsewhere classified: Secondary | ICD-10-CM

## 2023-10-05 MED ORDER — GADOPICLENOL 0.5 MMOL/ML IV SOLN
7.0000 mL | Freq: Once | INTRAVENOUS | Status: AC | PRN
Start: 2023-10-05 — End: 2023-10-05
  Administered 2023-10-05: 7 mL via INTRAVENOUS

## 2023-10-06 ENCOUNTER — Other Ambulatory Visit: Payer: Self-pay | Admitting: Obstetrics and Gynecology

## 2023-10-06 DIAGNOSIS — R928 Other abnormal and inconclusive findings on diagnostic imaging of breast: Secondary | ICD-10-CM

## 2023-10-14 ENCOUNTER — Other Ambulatory Visit

## 2023-10-14 ENCOUNTER — Encounter

## 2023-10-15 ENCOUNTER — Ambulatory Visit
Admission: RE | Admit: 2023-10-15 | Discharge: 2023-10-15 | Disposition: A | Discharge status: Home / Self Care | Source: Ambulatory Visit | Attending: Obstetrics and Gynecology | Admitting: Obstetrics and Gynecology

## 2023-10-15 DIAGNOSIS — R928 Other abnormal and inconclusive findings on diagnostic imaging of breast: Secondary | ICD-10-CM

## 2023-10-15 MED ORDER — GADOPICLENOL 0.5 MMOL/ML IV SOLN
7.0000 mL | Freq: Once | INTRAVENOUS | Status: AC | PRN
  Administered 2023-10-15: 7 mL via INTRAVENOUS

## 2023-10-16 LAB — SURGICAL PATHOLOGY
